# Patient Record
Sex: Female | Born: 1970 | Race: White | Hispanic: No | Smoking: Former smoker
Health system: Southern US, Community
[De-identification: ages and names within clinical notes are randomized; demographics above are authoritative.]

## PROBLEM LIST (undated history)

## (undated) DIAGNOSIS — F32A Depression, unspecified: Secondary | ICD-10-CM

## (undated) DIAGNOSIS — K219 Gastro-esophageal reflux disease without esophagitis: Secondary | ICD-10-CM

## (undated) DIAGNOSIS — S46912A Strain of unspecified muscle, fascia and tendon at shoulder and upper arm level, left arm, initial encounter: Secondary | ICD-10-CM

## (undated) DIAGNOSIS — E039 Hypothyroidism, unspecified: Secondary | ICD-10-CM

## (undated) DIAGNOSIS — F419 Anxiety disorder, unspecified: Secondary | ICD-10-CM

## (undated) DIAGNOSIS — F1010 Alcohol abuse, uncomplicated: Secondary | ICD-10-CM

## (undated) DIAGNOSIS — E079 Disorder of thyroid, unspecified: Secondary | ICD-10-CM

## (undated) DIAGNOSIS — F329 Major depressive disorder, single episode, unspecified: Secondary | ICD-10-CM

## (undated) DIAGNOSIS — F1020 Alcohol dependence, uncomplicated: Secondary | ICD-10-CM

## (undated) HISTORY — DX: Gastro-esophageal reflux disease without esophagitis: K21.9

## (undated) HISTORY — DX: Depression, unspecified: F32.A

## (undated) HISTORY — DX: Major depressive disorder, single episode, unspecified: F32.9

---

## 1998-01-25 ENCOUNTER — Other Ambulatory Visit: Admission: RE | Admit: 1998-01-25 | Discharge: 1998-01-25 | Payer: Self-pay | Admitting: Obstetrics and Gynecology

## 1998-02-23 ENCOUNTER — Inpatient Hospital Stay (HOSPITAL_COMMUNITY): Admission: AD | Admit: 1998-02-23 | Discharge: 1998-02-25 | Payer: Self-pay | Admitting: Obstetrics and Gynecology

## 1998-04-06 ENCOUNTER — Other Ambulatory Visit: Admission: RE | Admit: 1998-04-06 | Discharge: 1998-04-06 | Payer: Self-pay | Admitting: *Deleted

## 1999-05-16 ENCOUNTER — Other Ambulatory Visit: Admission: RE | Admit: 1999-05-16 | Discharge: 1999-05-16 | Payer: Self-pay | Admitting: Obstetrics and Gynecology

## 2000-02-03 ENCOUNTER — Inpatient Hospital Stay (HOSPITAL_COMMUNITY): Admission: AD | Admit: 2000-02-03 | Discharge: 2000-02-03 | Payer: Self-pay | Admitting: *Deleted

## 2000-02-11 ENCOUNTER — Inpatient Hospital Stay (HOSPITAL_COMMUNITY): Admission: AD | Admit: 2000-02-11 | Discharge: 2000-02-11 | Payer: Self-pay | Admitting: Obstetrics and Gynecology

## 2000-02-21 ENCOUNTER — Inpatient Hospital Stay (HOSPITAL_COMMUNITY): Admission: AD | Admit: 2000-02-21 | Discharge: 2000-02-23 | Payer: Self-pay | Admitting: Obstetrics and Gynecology

## 2000-02-25 ENCOUNTER — Encounter: Admission: RE | Admit: 2000-02-25 | Discharge: 2000-05-25 | Payer: Self-pay | Admitting: Obstetrics and Gynecology

## 2000-04-02 ENCOUNTER — Other Ambulatory Visit: Admission: RE | Admit: 2000-04-02 | Discharge: 2000-04-02 | Payer: Self-pay | Admitting: Obstetrics and Gynecology

## 2000-04-04 ENCOUNTER — Ambulatory Visit (HOSPITAL_COMMUNITY): Admission: RE | Admit: 2000-04-04 | Discharge: 2000-04-04 | Payer: Self-pay | Admitting: Obstetrics and Gynecology

## 2000-05-27 ENCOUNTER — Encounter: Admission: RE | Admit: 2000-05-27 | Discharge: 2000-07-24 | Payer: Self-pay | Admitting: Obstetrics and Gynecology

## 2001-04-29 ENCOUNTER — Other Ambulatory Visit: Admission: RE | Admit: 2001-04-29 | Discharge: 2001-04-29 | Payer: Self-pay | Admitting: Obstetrics and Gynecology

## 2002-08-18 ENCOUNTER — Other Ambulatory Visit: Admission: RE | Admit: 2002-08-18 | Discharge: 2002-08-18 | Payer: Self-pay | Admitting: Obstetrics and Gynecology

## 2002-11-21 ENCOUNTER — Inpatient Hospital Stay (HOSPITAL_COMMUNITY): Admission: EM | Admit: 2002-11-21 | Discharge: 2002-11-24 | Payer: Self-pay | Admitting: Psychiatry

## 2005-01-04 ENCOUNTER — Emergency Department (HOSPITAL_COMMUNITY): Admission: EM | Admit: 2005-01-04 | Discharge: 2005-01-04 | Payer: Self-pay | Admitting: Emergency Medicine

## 2005-10-21 ENCOUNTER — Emergency Department (HOSPITAL_COMMUNITY): Admission: EM | Admit: 2005-10-21 | Discharge: 2005-10-21 | Payer: Self-pay | Admitting: Emergency Medicine

## 2005-10-25 ENCOUNTER — Emergency Department (HOSPITAL_COMMUNITY): Admission: EM | Admit: 2005-10-25 | Discharge: 2005-10-26 | Payer: Self-pay | Admitting: Emergency Medicine

## 2006-04-12 ENCOUNTER — Ambulatory Visit (HOSPITAL_COMMUNITY): Admission: RE | Admit: 2006-04-12 | Discharge: 2006-04-12 | Payer: Self-pay | Admitting: Family Medicine

## 2014-11-02 LAB — TSH: TSH: 21.29 u[IU]/mL — AB (ref <=5.90)

## 2015-04-19 ENCOUNTER — Emergency Department (HOSPITAL_COMMUNITY)
Admission: EM | Admit: 2015-04-19 | Discharge: 2015-04-19 | Disposition: A | Discharge status: Home / Self Care | Payer: Medicaid Other | Attending: Emergency Medicine | Admitting: Emergency Medicine

## 2015-04-19 ENCOUNTER — Emergency Department (HOSPITAL_COMMUNITY): Payer: Medicaid Other

## 2015-04-19 ENCOUNTER — Encounter (HOSPITAL_COMMUNITY): Payer: Self-pay

## 2015-04-19 DIAGNOSIS — Z8639 Personal history of other endocrine, nutritional and metabolic disease: Secondary | ICD-10-CM | POA: Insufficient documentation

## 2015-04-19 DIAGNOSIS — Z3202 Encounter for pregnancy test, result negative: Secondary | ICD-10-CM | POA: Diagnosis not present

## 2015-04-19 DIAGNOSIS — N938 Other specified abnormal uterine and vaginal bleeding: Secondary | ICD-10-CM | POA: Insufficient documentation

## 2015-04-19 DIAGNOSIS — Z880 Allergy status to penicillin: Secondary | ICD-10-CM | POA: Insufficient documentation

## 2015-04-19 DIAGNOSIS — Z87891 Personal history of nicotine dependence: Secondary | ICD-10-CM | POA: Insufficient documentation

## 2015-04-19 DIAGNOSIS — N12 Tubulo-interstitial nephritis, not specified as acute or chronic: Secondary | ICD-10-CM | POA: Insufficient documentation

## 2015-04-19 DIAGNOSIS — R319 Hematuria, unspecified: Secondary | ICD-10-CM | POA: Diagnosis present

## 2015-04-19 HISTORY — DX: Disorder of thyroid, unspecified: E07.9

## 2015-04-19 LAB — CBC
HCT: 37.9 % (ref 36.0–46.0)
Hemoglobin: 12.7 g/dL (ref 12.0–15.0)
MCH: 32 pg (ref 26.0–34.0)
MCHC: 33.5 g/dL (ref 30.0–36.0)
MCV: 95.5 fL (ref 78.0–100.0)
Platelets: 324 10*3/uL (ref 150–400)
RBC: 3.97 MIL/uL (ref 3.87–5.11)
RDW: 12.2 % (ref 11.5–15.5)
WBC: 11.1 10*3/uL — ABNORMAL HIGH (ref 4.0–10.5)

## 2015-04-19 LAB — URINALYSIS, ROUTINE W REFLEX MICROSCOPIC
Bilirubin Urine: NEGATIVE
Glucose, UA: NEGATIVE mg/dL
Ketones, ur: NEGATIVE mg/dL
Nitrite: POSITIVE — AB
Protein, ur: 30 mg/dL — AB
Specific Gravity, Urine: 1.015 (ref 1.005–1.030)
Urobilinogen, UA: 0.2 mg/dL (ref 0.0–1.0)
pH: 6 (ref 5.0–8.0)

## 2015-04-19 LAB — URINE MICROSCOPIC-ADD ON

## 2015-04-19 LAB — BASIC METABOLIC PANEL
Anion gap: 6 (ref 5–15)
BUN: 7 mg/dL (ref 6–20)
CO2: 28 mmol/L (ref 22–32)
Calcium: 8.5 mg/dL — ABNORMAL LOW (ref 8.9–10.3)
Chloride: 100 mmol/L — ABNORMAL LOW (ref 101–111)
Creatinine, Ser: 0.65 mg/dL (ref 0.44–1.00)
GFR calc Af Amer: >60 mL/min (ref >60)
GFR calc non Af Amer: >60 mL/min (ref >60)
Glucose, Bld: 83 mg/dL (ref 65–99)
Potassium: 3.3 mmol/L — ABNORMAL LOW (ref 3.5–5.1)
Sodium: 134 mmol/L — ABNORMAL LOW (ref 135–145)

## 2015-04-19 LAB — PREGNANCY, URINE: Preg Test, Ur: NEGATIVE

## 2015-04-19 MED ORDER — KETOROLAC TROMETHAMINE 30 MG/ML IJ SOLN
30.0000 mg | Freq: Once | INTRAMUSCULAR | Status: AC
  Administered 2015-04-19: 30 mg via INTRAVENOUS
  Filled 2015-04-19: qty 1

## 2015-04-19 MED ORDER — SODIUM CHLORIDE 0.9 % IV BOLUS (SEPSIS)
1000.0000 mL | Freq: Once | INTRAVENOUS | Status: AC
  Administered 2015-04-19: 1000 mL via INTRAVENOUS

## 2015-04-19 MED ORDER — CEPHALEXIN 500 MG PO CAPS
500.0000 mg | ORAL_CAPSULE | Freq: Three times a day (TID) | ORAL | Status: DC
Start: 2015-04-19 — End: 2016-02-03

## 2015-04-19 MED ORDER — OXYCODONE-ACETAMINOPHEN 5-325 MG PO TABS: 1.0000 | ORAL_TABLET | Freq: Four times a day (QID) | ORAL | Status: DC | PRN

## 2015-04-19 MED ORDER — DEXTROSE 5 % IV SOLN
1.0000 g | Freq: Once | INTRAVENOUS | Status: AC
  Administered 2015-04-19: 1 g via INTRAVENOUS
  Filled 2015-04-19: qty 10

## 2015-04-19 MED ORDER — MORPHINE SULFATE (PF) 4 MG/ML IV SOLN
6.0000 mg | Freq: Once | INTRAVENOUS | Status: AC
  Administered 2015-04-19: 6 mg via INTRAVENOUS
  Filled 2015-04-19: qty 2

## 2015-04-19 MED ORDER — IBUPROFEN 600 MG PO TABS: 600.0000 mg | ORAL_TABLET | Freq: Three times a day (TID) | ORAL | Status: DC | PRN

## 2015-04-19 NOTE — ED Provider Notes (Signed)
CSN: [id number]     Arrival date & time 04/19/15  0930 History  This chart was scribed for Jola Schmidt, MD by Terressa Koyanagi, ED Scribe. This patient was seen in room APA02/APA02 and the patient's care was started at 11:34 AM.   Chief Complaint  Patient presents with  . Back Pain  . Hematuria   Patient is a 44 y.o. female presenting with hematuria. The history is provided by the patient. No language interpreter was used.  Hematuria Associated symptoms include abdominal pain.   PCP: Leonard Downing, MD HPI Comments: Nikyla Navedo is a 44 y.o. female who presents to the Emergency Department complaining of urinary frequency onset approximately one week ago; and, lower back pain, left lower abd pain, nausea, chills and vaginal bleeding  (dripping blood when she urinates) onset 3 days ago. Pt reports that she was seen by her PCP for urinary frequency last week who started her on bactrim; pt completed the bactrim without improvement. Pt denies any Hx of kidney stones.   Past Medical History  Diagnosis Date  . Thyroid disease    History reviewed. No pertinent past surgical history. No family history on file. Social History  Substance Use Topics  . Smoking status: Former Research scientist (life sciences)  . Smokeless tobacco: None  . Alcohol Use: No   OB History    No data available     Review of Systems  Constitutional: Positive for chills.  Gastrointestinal: Positive for nausea and abdominal pain.  Genitourinary: Positive for vaginal bleeding.  Musculoskeletal: Positive for back pain.  All other systems reviewed and are negative.  Allergies  Hydrocodone and Penicillins  Home Medications   Prior to Admission medications   Not on File   Triage Vitals: BP 117/75 mmHg  Pulse 91  Temp(Src) 98.4 F (36.9 C) (Oral)  Resp 18  Ht 5\' 4"  (1.626 m)  Wt 129 lb (58.514 kg)  BMI 22.13 kg/m2  SpO2 100%  LMP 04/05/2015 Physical Exam  Constitutional: She is oriented to person, place, and time. She appears  well-developed and well-nourished. No distress.  HENT:  Head: Normocephalic and atraumatic.  Eyes: EOM are normal.  Neck: Normal range of motion.  Cardiovascular: Normal rate, regular rhythm and normal heart sounds.   Pulmonary/Chest: Effort normal and breath sounds normal.  Abdominal: Soft. She exhibits no distension. There is no tenderness.  Musculoskeletal: Normal range of motion.  Neurological: She is alert and oriented to person, place, and time.  Skin: Skin is warm and dry.  Psychiatric: She has a normal mood and affect. Judgment normal.  Nursing note and vitals reviewed.   ED Course  Procedures (including critical care time) DIAGNOSTIC STUDIES: Oxygen Saturation is 100% on RA, nl by my interpretation.    COORDINATION OF CARE: 11:40 AM: Discussed treatment plan which includes pain meds, antibiotics, imaging with pt at bedside; patient verbalizes understanding and agrees with treatment plan.   Labs Review Labs Reviewed  URINALYSIS, ROUTINE W REFLEX MICROSCOPIC (NOT AT [facility]) - Abnormal; Notable for the following:    APPearance TURBID (*)    Hgb urine dipstick LARGE (*)    Protein, ur 30 (*)    Nitrite POSITIVE (*)    Leukocytes, UA MODERATE (*)    All other components within normal limits  URINE MICROSCOPIC-ADD ON - Abnormal; Notable for the following:    Bacteria, UA MANY (*)    All other components within normal limits  CBC - Abnormal; Notable for the following:    WBC 11.1 (*)  All other components within normal limits  BASIC METABOLIC PANEL - Abnormal; Notable for the following:    Sodium 134 (*)    Potassium 3.3 (*)    Chloride 100 (*)    Calcium 8.5 (*)    All other components within normal limits  URINE CULTURE  PREGNANCY, URINE    Imaging Review US Renal  04/19/2015   CLINICAL DATA:  Flank pain bilaterally for 2 weeks  EXAM: RENAL / URINARY TRACT ULTRASOUND COMPLETE  COMPARISON:  CT abdomen and pelvis April 12, 2006  FINDINGS: Right Kidney:  Length:  11.8 cm. Echogenicity and renal cortical thickness are within normal limits. No mass, perinephric fluid, or hydronephrosis visualized. No sonographically demonstrable calculus or ureterectasis.  Left Kidney:  Length: 10.8 cm. Echogenicity and renal cortical thickness are within normal limits. No mass, perinephric fluid, or hydronephrosis visualized. No sonographically demonstrable calculus or ureterectasis.  Bladder:  Appears normal for degree of bladder distention.  IMPRESSION: Study within normal limits.   Electronically Signed   By: Lowella Grip III M.D.   On: 04/19/2015 12:54   I have personally reviewed and evaluated these images and lab results as part of my medical decision-making.   EKG Interpretation None      MDM   Final diagnoses:  Pyelonephritis    1:09 PM Patient feels much better this time.  Symptoms consistent with pyelonephritis.  Primary care follow-up.  Patient understands return to ER for new or worsening symptoms.  Renal ultrasound demonstrates no unilateral hide her.  Urine culture sent.  IV Rocephin in the emergency department.  Home with Keflex.  I personally performed the services described in this documentation, which was scribed in my presence. The recorded information has been reviewed and is accurate.       Jola Schmidt, MD 04/19/15 1309

## 2015-04-19 NOTE — ED Notes (Signed)
Pt reports last weekend she started having UTI symptoms and was out of town.  Says she called her doctor and he called her in a prescription for bactrim.  Reports pt took all of her antibiotics and still c/o urinary frequency and now having pain in lower back and left lower abd.  Also reports gross amount of blood in urine and urine cloudy.  Pt says has had fever as well.

## 2015-04-19 NOTE — Discharge Instructions (Signed)
Pyelonephritis, Adult °Pyelonephritis is a kidney infection. In general, there are 2 main types of pyelonephritis: °· Infections that come on quickly without any warning (acute pyelonephritis). °· Infections that persist for a long period of time (chronic pyelonephritis). °CAUSES  °Two main causes of pyelonephritis are: °· Bacteria traveling from the bladder to the kidney. This is a problem especially in pregnant women. The urine in the bladder can become filled with bacteria from multiple causes, including: °¨ Inflammation of the prostate gland (prostatitis). °¨ Sexual intercourse in females. °¨ Bladder infection (cystitis). °· Bacteria traveling from the bloodstream to the tissue part of the kidney. °Problems that may increase your risk of getting a kidney infection include: °· Diabetes. °· Kidney stones or bladder stones. °· Cancer. °· Catheters placed in the bladder. °· Other abnormalities of the kidney or ureter. °SYMPTOMS  °· Abdominal pain. °· Pain in the side or flank area. °· Fever. °· Chills. °· Upset stomach. °· Blood in the urine (dark urine). °· Frequent urination. °· Strong or persistent urge to urinate. °· Burning or stinging when urinating. °DIAGNOSIS  °Your caregiver may diagnose your kidney infection based on your symptoms. A urine sample may also be taken. °TREATMENT  °In general, treatment depends on how severe the infection is.  °· If the infection is mild and caught early, your caregiver may treat you with oral antibiotics and send you home. °· If the infection is more severe, the bacteria may have gotten into the bloodstream. This will require intravenous (IV) antibiotics and a hospital stay. Symptoms may include: °¨ High fever. °¨ Severe flank pain. °¨ Shaking chills. °· Even after a hospital stay, your caregiver may require you to be on oral antibiotics for a period of time. °· Other treatments may be required depending upon the cause of the infection. °HOME CARE INSTRUCTIONS  °· Take your  antibiotics as directed. Finish them even if you start to feel better. °· Make an appointment to have your urine checked to make sure the infection is gone. °· Drink enough fluids to keep your urine clear or pale yellow. °· Take medicines for the bladder if you have urgency and frequency of urination as directed by your caregiver. °SEEK IMMEDIATE MEDICAL CARE IF:  °· You have a fever or persistent symptoms for more than 2-3 days. °· You have a fever and your symptoms suddenly get worse. °· You are unable to take your antibiotics or fluids. °· You develop shaking chills. °· You experience extreme weakness or fainting. °· There is no improvement after 2 days of treatment. °MAKE SURE YOU: °· Understand these instructions. °· Will watch your condition. °· Will get help right away if you are not doing well or get worse. °Document Released: 07/24/2005 Document Revised: 01/23/2012 Document Reviewed: 12/28/2010 °ExitCare® Patient Information ©2015 [employer]. This information is not intended to replace advice given to you by your health care provider. Make sure you discuss any questions you have with your health care provider. ° °

## 2015-04-22 LAB — URINE CULTURE: Culture: >=100000

## 2015-04-24 ENCOUNTER — Telehealth (HOSPITAL_COMMUNITY): Payer: Self-pay | Admitting: Emergency Medicine

## 2015-04-24 NOTE — Telephone Encounter (Signed)
Post ED Visit - Positive Culture Follow-up  Culture report reviewed by antimicrobial stewardship pharmacist:  []  Heide Guile, Pharm.D., BCPS []  Alycia Rossetti, Pharm.D., BCPS []  [place], Pharm.D., BCPS, AAHIVP []  Legrand Como, Pharm.D., BCPS, AAHIVP []  [place], Pharm.D. [x]  Cassie Nicole Kindred, [place].D.  Positive Urine culture Treated with Cephalexin, organism sensitive to the same and no further patient follow-up is required at this time.  Ernesta Amble 04/24/2015, 4:42 PM

## 2016-01-24 ENCOUNTER — Telehealth: Payer: Self-pay | Admitting: Orthopedic Surgery

## 2016-01-24 NOTE — Telephone Encounter (Addendum)
Call received from patient regarding an injury to her left shoulder at [facility] on 01/15/16.  States was treated at Urgent care there and has physician notes and report.  Relayed need films as well.  States she will speak with facility and request copy of films.  Also discussed insurance, which, per system, does not appear to require primary care referral.  Patient aware appointment pending.  Ph# [phone]

## 2016-01-26 NOTE — Telephone Encounter (Signed)
Called back to patient to follow up, as no response.  Patient states she is on way today back to [facility] and will pick up copy of report and film/C.D, then will call to schedule appointment when she returns.

## 2016-02-03 ENCOUNTER — Encounter: Payer: Self-pay | Admitting: Orthopaedic Surgery

## 2016-02-03 ENCOUNTER — Ambulatory Visit (INDEPENDENT_AMBULATORY_CARE_PROVIDER_SITE_OTHER): Payer: Medicaid Other | Admitting: Orthopaedic Surgery

## 2016-02-03 VITALS — BP 128/87 | HR 81 | Temp 97.7°F | Resp 16 | Ht 64.0 in | Wt 143.0 lb

## 2016-02-03 DIAGNOSIS — M25512 Pain in left shoulder: Secondary | ICD-10-CM | POA: Diagnosis not present

## 2016-02-03 MED ORDER — HYDROCODONE-ACETAMINOPHEN 5-325 MG PO TABS: 1.0000 | ORAL_TABLET | ORAL | Status: DC | PRN

## 2016-02-03 MED ORDER — NAPROXEN 500 MG PO TABS: 500.0000 mg | ORAL_TABLET | Freq: Two times a day (BID) | ORAL | Status: DC

## 2016-02-03 NOTE — Progress Notes (Signed)
Subjective: I hurt my left shoulder two weeks ago    Patient ID: Ebony Jones, female    DOB: 15-Oct-1970, 45 y.o.   MRN: [id number]  HPI She fell while at [facility] two weeks ago and hurt her left shoulder.  She had been drinking and lost her balance.  She was seen in the ER in [place], [place].  I have copies of the ER report, x-ray report and x-rays all of which I have reviewed.  She had no other injury.  Over the last two weeks her left shoulder has continued to hurt and she has not improved.  She has pain with overhead use and with abduction.  She has no numbness.  She has taken Tylenol and used ice with little help.  She has problems sleeping.  She has no swelling or redness.   Review of Systems  HENT: Negative for congestion.   Respiratory: Negative for cough and shortness of breath.   Cardiovascular: Negative for chest pain and leg swelling.  Endocrine: Positive for cold intolerance.  Musculoskeletal: Positive for arthralgias.  Allergic/Immunologic: Positive for environmental allergies.   Past Medical History  Diagnosis Date  . Thyroid disease     History reviewed. No pertinent past surgical history.  No current outpatient prescriptions on file prior to visit.   No current facility-administered medications on file prior to visit.    Social History   Social History  . Marital Status: Divorced    Spouse Name: N/A  . Number of Children: N/A  . Years of Education: N/A   Occupational History  . Not on file.   Social History Main Topics  . Smoking status: Former Research scientist (life sciences)  . Smokeless tobacco: Not on file  . Alcohol Use: No  . Drug Use: No  . Sexual Activity: Not on file   Other Topics Concern  . Not on file   Social History Narrative    BP 128/87 mmHg  Pulse 81  Temp(Src) 97.7 F (36.5 C)  Resp 16  Ht 5\' 4"  (1.626 m)  Wt 143 lb (64.864 kg)  BMI 24.53 kg/m2      Objective:   Physical Exam  Constitutional: She is oriented to person, place,  and time. She appears well-developed and well-nourished.  HENT:  Head: Normocephalic and atraumatic.  Eyes: Conjunctivae and EOM are normal. Pupils are equal, round, and reactive to light.  Neck: Normal range of motion. Neck supple.  Cardiovascular: Normal rate, regular rhythm and intact distal pulses.   Pulmonary/Chest: Effort normal.  Abdominal: Soft.  Musculoskeletal: She exhibits tenderness (Left shoulder has tenderness, abduction 145, forward flexion 160, internal 20, external 30, extension full but tender, adduction full.  NV intact.  Grips normal.  Neck negative.  Right shoulder negative.).  Neurological: She is alert and oriented to person, place, and time. She displays normal reflexes. No cranial nerve deficit. She exhibits normal muscle tone. Coordination normal.  Skin: Skin is warm and dry.  Psychiatric: She has a normal mood and affect. Her behavior is normal. Judgment and thought content normal.          Assessment & Plan:   Encounter Diagnosis  Name Primary?  . Left shoulder pain Yes   I feel she has a rotator cuff stain as well.  I have gone over exercises for her to do.  I have shown them to her.  I will have her do them at home.  I have given Rx for Naprosyn and Norco 5.0.  Precautions  discussed.  Call if any problems.  Return in two weeks.  Consider MRI if not getting better.  Electronically Signed Sanjuana Kava, MD 6/29/20173:51 PM

## 2016-02-16 ENCOUNTER — Encounter: Payer: Self-pay | Admitting: Orthopaedic Surgery

## 2016-02-16 ENCOUNTER — Ambulatory Visit: Payer: Medicaid Other | Admitting: Orthopaedic Surgery

## 2016-02-23 ENCOUNTER — Encounter: Payer: Self-pay | Admitting: Orthopaedic Surgery

## 2016-02-23 ENCOUNTER — Ambulatory Visit (INDEPENDENT_AMBULATORY_CARE_PROVIDER_SITE_OTHER): Payer: Medicaid Other | Admitting: Orthopaedic Surgery

## 2016-02-23 VITALS — BP 116/77 | HR 83 | Ht 63.0 in | Wt 146.6 lb

## 2016-02-23 DIAGNOSIS — M25512 Pain in left shoulder: Secondary | ICD-10-CM

## 2016-02-23 NOTE — Progress Notes (Signed)
Patient Ebony Jones, female DOB:1971/04/25, 45 y.o. [id number]  Chief Complaint  Patient presents with  . Follow-up    left shoulder    HPI  Ebony Jones is a 45 y.o. female who has left shoulder pain.  She is improved.  She has less pain and more motion.  She is doing her exercises.  She still has an area of motion that is painful and still has some soreness.  But she is pleased with her progress to date.  She is to continue her exercises.  She is to gradually increase the weight she lifts.  HPI  Body mass index is 25.98 kg/(m^2).  ROS  Review of Systems  HENT: Negative for congestion.   Respiratory: Negative for cough and shortness of breath.   Cardiovascular: Negative for chest pain and leg swelling.  Endocrine: Positive for cold intolerance.  Musculoskeletal: Positive for arthralgias.  Allergic/Immunologic: Positive for environmental allergies.    Past Medical History  Diagnosis Date  . Thyroid disease     History reviewed. No pertinent past surgical history.  History reviewed. No pertinent family history.  Social History Social History  Substance Use Topics  . Smoking status: Former Research scientist (life sciences)  . Smokeless tobacco: None  . Alcohol Use: No    Allergies  Allergen Reactions  . Hydrocodone Rash  . Penicillins Rash    Current Outpatient Prescriptions  Medication Sig Dispense Refill  . HYDROcodone-acetaminophen (NORCO/VICODIN) 5-325 MG tablet Take 1 tablet by mouth every 4 (four) hours as needed for moderate pain (Must last 15 days.Do not take and drive a car or use machinery.). 60 tablet 0  . levothyroxine (SYNTHROID, LEVOTHROID) 112 MCG tablet Take 112 mcg by mouth daily before breakfast.    . naproxen (NAPROSYN) 500 MG tablet Take 1 tablet (500 mg total) by mouth 2 (two) times daily with a meal. 60 tablet 5  . sertraline (ZOLOFT) 100 MG tablet Take 100 mg by mouth daily.     No current facility-administered medications for this visit.     Physical  Exam  Blood pressure 116/77, pulse 83, height 5\' 3"  (1.6 m), weight 146 lb 9.6 oz (66.497 kg).  Constitutional: overall normal hygiene, normal nutrition, well developed, normal grooming, normal body habitus. Assistive device:none  Musculoskeletal: gait and station Limp none, muscle tone and strength are normal, no tremors or atrophy is present.  .  Neurological: coordination overall normal.  Deep tendon reflex/nerve stretch intact.  Sensation normal.  Cranial nerves II-XII intact.   Skin:   normal overall no scars, lesions, ulcers or rashes. No psoriasis.  Psychiatric: Alert and oriented x 3.  Recent memory intact, remote memory unclear.  Normal mood and affect. Well groomed.  Good eye contact.  Cardiovascular: overall no swelling, no varicosities, no edema bilaterally, normal temperatures of the legs and arms, no clubbing, cyanosis and good capillary refill.  Lymphatic: palpation is normal.  Examination of left Upper Extremity is done.  Inspection:   Overall:  Elbow non-tender without crepitus or defects, forearm non-tender without crepitus or defects, wrist non-tender without crepitus or defects, hand non-tender.    Shoulder: with glenohumeral joint tenderness, without effusion.   Upper arm: without swelling and tenderness   Range of motion:   Overall:  Full range of motion of the elbow, full range of motion of wrist and full range of motion in fingers.   Shoulder:  left  165 degrees forward flexion; 145 degrees abduction; 40 degrees internal rotation, 40 degrees external rotation, 20 degrees extension, 40  degrees adduction.   Stability:   Overall:  Shoulder, elbow and wrist stable   Strength and Tone:   Overall full shoulder muscles strength, full upper arm strength and normal upper arm bulk and tone.   The patient has been educated about the nature of the problem(s) and counseled on treatment options.  The patient appeared to understand what I have discussed and is in  agreement with it.  Encounter Diagnosis  Name Primary?  . Left shoulder pain Yes    PLAN Call if any problems.  Precautions discussed.  Continue current medications.   Return to clinic 1 month   Continue exercises at home.  Electronically Signed Sanjuana Kava, MD 7/19/201711:08 PM

## 2016-03-16 ENCOUNTER — Encounter (HOSPITAL_COMMUNITY): Payer: Self-pay

## 2016-03-16 ENCOUNTER — Emergency Department (HOSPITAL_COMMUNITY)
Admission: EM | Admit: 2016-03-16 | Discharge: 2016-03-16 | Disposition: A | Discharge status: Home / Self Care | Payer: Medicaid Other | Attending: Emergency Medicine | Admitting: Emergency Medicine

## 2016-03-16 ENCOUNTER — Emergency Department (HOSPITAL_COMMUNITY): Payer: Medicaid Other

## 2016-03-16 DIAGNOSIS — Y929 Unspecified place or not applicable: Secondary | ICD-10-CM | POA: Insufficient documentation

## 2016-03-16 DIAGNOSIS — Y999 Unspecified external cause status: Secondary | ICD-10-CM | POA: Diagnosis not present

## 2016-03-16 DIAGNOSIS — Z79899 Other long term (current) drug therapy: Secondary | ICD-10-CM | POA: Insufficient documentation

## 2016-03-16 DIAGNOSIS — S42251A Displaced fracture of greater tuberosity of right humerus, initial encounter for closed fracture: Secondary | ICD-10-CM | POA: Diagnosis not present

## 2016-03-16 DIAGNOSIS — W109XXA Fall (on) (from) unspecified stairs and steps, initial encounter: Secondary | ICD-10-CM | POA: Diagnosis not present

## 2016-03-16 DIAGNOSIS — Y939 Activity, unspecified: Secondary | ICD-10-CM | POA: Insufficient documentation

## 2016-03-16 DIAGNOSIS — S42252A Displaced fracture of greater tuberosity of left humerus, initial encounter for closed fracture: Secondary | ICD-10-CM

## 2016-03-16 DIAGNOSIS — Z87891 Personal history of nicotine dependence: Secondary | ICD-10-CM | POA: Diagnosis not present

## 2016-03-16 DIAGNOSIS — S4991XA Unspecified injury of right shoulder and upper arm, initial encounter: Secondary | ICD-10-CM | POA: Diagnosis present

## 2016-03-16 LAB — CBC WITH DIFFERENTIAL/PLATELET
Basophils Absolute: 0 10*3/uL (ref 0.0–0.1)
Basophils Relative: 1 %
Eosinophils Absolute: 0.2 10*3/uL (ref 0.0–0.7)
Eosinophils Relative: 3 %
HCT: 38.4 % (ref 36.0–46.0)
Hemoglobin: 13.1 g/dL (ref 12.0–15.0)
Lymphocytes Relative: 34 %
Lymphs Abs: 2.6 10*3/uL (ref 0.7–4.0)
MCH: 32.6 pg (ref 26.0–34.0)
MCHC: 34.1 g/dL (ref 30.0–36.0)
MCV: 95.5 fL (ref 78.0–100.0)
Monocytes Absolute: 0.7 10*3/uL (ref 0.1–1.0)
Monocytes Relative: 10 %
Neutro Abs: 4 10*3/uL (ref 1.7–7.7)
Neutrophils Relative %: 52 %
Platelets: 233 10*3/uL (ref 150–400)
RBC: 4.02 MIL/uL (ref 3.87–5.11)
RDW: 12.2 % (ref 11.5–15.5)
WBC: 7.5 10*3/uL (ref 4.0–10.5)

## 2016-03-16 LAB — COMPREHENSIVE METABOLIC PANEL
ALT: 10 U/L — ABNORMAL LOW (ref 14–54)
AST: 16 U/L (ref 15–41)
Albumin: 3.9 g/dL (ref 3.5–5.0)
Alkaline Phosphatase: 47 U/L (ref 38–126)
Anion gap: 9 (ref 5–15)
BUN: 10 mg/dL (ref 6–20)
CO2: 22 mmol/L (ref 22–32)
Calcium: 8.1 mg/dL — ABNORMAL LOW (ref 8.9–10.3)
Chloride: 108 mmol/L (ref 101–111)
Creatinine, Ser: 0.64 mg/dL (ref 0.44–1.00)
GFR calc Af Amer: >60 mL/min (ref >60)
GFR calc non Af Amer: >60 mL/min (ref >60)
Glucose, Bld: 75 mg/dL (ref 65–99)
Potassium: 3.4 mmol/L — ABNORMAL LOW (ref 3.5–5.1)
Sodium: 139 mmol/L (ref 135–145)
Total Bilirubin: 0.3 mg/dL (ref 0.3–1.2)
Total Protein: 6.5 g/dL (ref 6.5–8.1)

## 2016-03-16 LAB — RAPID URINE DRUG SCREEN, HOSP PERFORMED
Amphetamines: NOT DETECTED
Barbiturates: NOT DETECTED
Benzodiazepines: NOT DETECTED
Cocaine: NOT DETECTED
Opiates: NOT DETECTED
Tetrahydrocannabinol: NOT DETECTED

## 2016-03-16 LAB — ETHANOL: Alcohol, Ethyl (B): 248 mg/dL — ABNORMAL HIGH (ref <5)

## 2016-03-16 MED ORDER — IBUPROFEN 400 MG PO TABS: 400.0000 mg | ORAL_TABLET | Freq: Four times a day (QID) | ORAL | 0 refills | Status: DC | PRN

## 2016-03-16 MED ORDER — IBUPROFEN 800 MG PO TABS
800.0000 mg | ORAL_TABLET | Freq: Once | ORAL | Status: AC
  Administered 2016-03-16: 800 mg via ORAL
  Filled 2016-03-16: qty 1

## 2016-03-16 NOTE — ED Provider Notes (Signed)
[facility] DEPT Provider Note   CSN: [id number] Arrival date & time: 03/16/16  1702  First Provider Contact:  First MD Initiated Contact with Patient 03/16/16 1726        History   Chief Complaint Chief Complaint  Patient presents with  . Fall  . Other    detox    HPI Ebony Jones is a 45 y.o. female.  Dog tripped owner, she can't state how she fell but is having pain in the R shoulder - hx of problems in this joint int he past - ? rotater cuff problems.  She has had dec ROM since the fall b/c of pain.  She has no numbness / tingling in the hand.  She has no pain at the elbow or wrist - can move neck in all directions.  She has no injury to the head or LOC.   She was drinking ETOH today.  Sx occurred just prior to arrival.  Arrived by EMS.  Also c/o ETOH abuse and reqeusting help.  - no depresseion  / SI      Past Medical History:  Diagnosis Date  . Thyroid disease     There are no active problems to display for this patient.   History reviewed. No pertinent surgical history.  OB History    No data available       Home Medications    Prior to Admission medications   Medication Sig Start Date End Date Taking? Authorizing Provider  levothyroxine (SYNTHROID, LEVOTHROID) 112 MCG tablet Take 112 mcg by mouth daily before breakfast.   Yes Historical Provider, MD  HYDROcodone-acetaminophen (NORCO/VICODIN) 5-325 MG tablet Take 1 tablet by mouth every 4 (four) hours as needed for moderate pain (Must last 15 days.Do not take and drive a car or use machinery.). Patient not taking: Reported on 03/16/2016 02/03/16   Sanjuana Kava, MD  ibuprofen (ADVIL,MOTRIN) 400 MG tablet Take 1 tablet (400 mg total) by mouth every 6 (six) hours as needed. 03/16/16   Noemi Chapel, MD  naproxen (NAPROSYN) 500 MG tablet Take 1 tablet (500 mg total) by mouth 2 (two) times daily with a meal. Patient not taking: Reported on 03/16/2016 02/03/16   Sanjuana Kava, MD    Family History No family  history on file.  Social History Social History  Substance Use Topics  . Smoking status: Former Research scientist (life sciences)  . Smokeless tobacco: Never Used  . Alcohol use No     Allergies   Hydrocodone and Penicillins   Review of Systems Review of Systems  All other systems reviewed and are negative.    Physical Exam Updated Vital Signs BP 113/65 (BP Location: Right Arm)   Pulse 120   Temp 98.9 F (37.2 C) (Oral)   Resp 20   LMP 02/24/2016   SpO2 96%   Physical Exam  Constitutional: She appears well-developed and well-nourished. No distress.  HENT:  Head: Normocephalic and atraumatic.  Mouth/Throat: Oropharynx is clear and moist. No oropharyngeal exudate.  Eyes: Conjunctivae and EOM are normal. Pupils are equal, round, and reactive to light. Right eye exhibits no discharge. Left eye exhibits no discharge. No scleral icterus.  Neck: Normal range of motion. Neck supple. No JVD present. No thyromegaly present.  Cardiovascular: Normal rate, regular rhythm, normal heart sounds and intact distal pulses.  Exam reveals no gallop and no friction rub.   No murmur heard. Pulmonary/Chest: Effort normal and breath sounds normal. No respiratory distress. She has no wheezes. She has no rales.  Abdominal: Soft.  Bowel sounds are normal. She exhibits no distension and no mass. There is no tenderness.  Musculoskeletal: She exhibits tenderness ( ttp over the shoulder). She exhibits no edema.  Dec ROM 2/2 pain but only mildly, able to lift arm above head without wincing.  Normal pulses at the wrist.  Lymphadenopathy:    She has no cervical adenopathy.  Neurological: She is alert. Coordination normal.  Normal strength / sensation / speech (appears intox)  Skin: Skin is warm and dry. No rash noted. No erythema.  Psychiatric:  Tearful but no suicidal thoughts Drinks > 1/5th of liquor daily - hiding it  Nursing note and vitals reviewed.    ED Treatments / Results  Labs (all labs ordered are listed, but  only abnormal results are displayed) Labs Reviewed  ETHANOL - Abnormal; Notable for the following:       Result Value   Alcohol, Ethyl (B) 248 (*)    All other components within normal limits  COMPREHENSIVE METABOLIC PANEL - Abnormal; Notable for the following:    Potassium 3.4 (*)    Calcium 8.1 (*)    ALT 10 (*)    All other components within normal limits  CBC WITH DIFFERENTIAL/PLATELET  URINE RAPID DRUG SCREEN, HOSP PERFORMED    EKG  EKG Interpretation None       Radiology Dg Shoulder Left  Result Date: 03/16/2016 CLINICAL DATA:  Status post trip and fall over a dog today with a left shoulder injury. Pain. Initial encounter. EXAM: LEFT SHOULDER - 2+ VIEW COMPARISON:  None. FINDINGS: The patient has a mildly impacted and comminuted fracture of the greater tuberosity. No other fracture is identified. The humerus is located and the acromioclavicular joint is intact. Imaged left lung and ribs appear normal. IMPRESSION: Mildly impacted and comminuted greater tuberosity fracture. Electronically Signed   By: Inge Rise M.D.   On: 03/16/2016 17:45    Procedures Procedures (including critical care time)  Medications Ordered in ED Medications  ibuprofen (ADVIL,MOTRIN) tablet 800 mg (800 mg Oral Given 03/16/16 1915)     Initial Impression / Assessment and Plan / ED Course  I have reviewed the triage vital signs and the nursing notes.  Pertinent labs & imaging results that were available during my care of the patient were reviewed by me and considered in my medical decision making (see chart for details).  Clinical Course  Comment By Time  Pt requesting d/c - will give copy of results Has sober ride, informed of xray findings - sling provided Noemi Chapel, MD 08/10 1930   Not sucidal  Final Clinical Impressions(s) / ED Diagnoses   Final diagnoses:  Greater tuberosity of humerus fracture, left, closed, initial encounter    New Prescriptions New Prescriptions    IBUPROFEN (ADVIL,MOTRIN) 400 MG TABLET    Take 1 tablet (400 mg total) by mouth every 6 (six) hours as needed.     Noemi Chapel, MD 03/16/16 [phone]

## 2016-03-16 NOTE — ED Triage Notes (Signed)
Pt says she tripped over her dog and fell down 3 steps hitting left shoulder.  Reports she already had an injury to that shoulder.  Pt also reports has been drinking etoh today. Pt says she drinks heavily twice a week and wants help to stop drinking.

## 2016-03-16 NOTE — Discharge Instructions (Signed)
You have a chip type fracture of your left shoulder - you can wear the sling for a week and motrin for pain control - I have attached your blood work should you decide to seek help with your alcoholSubstance Abuse Treatment Programs  Intensive Outpatient Programs [facility]     [address], [place]       [facility] [place] [address] [place], [place]  [facility]     (Inpatient and outpatient)     [address]           [place] [phone] (Suboxone and Methadone)  [place], [place] [address]      [place] [address] [phone] [place], [place]  [facility] (Outpatient/Inpatient, Chemical)    (insurance only) [phone]             [facility] ([place]) [place], [place]     [facility]     [address]     [place], [place]       [facility] (for caregivers and family) [phone] Kristeen Mans. [place], [place]      Residential Treatment Programs [facility]      [address], [place], [place] [address]  [phone]       T.R.O.S.Kyle., [place], [place] [address] [phone]  [facility] [place]        [phone]       [facility] [phone]  [facility] (Short.)             [place], [place] or [place] of [place], [address] [phone]  [facility]    [address]      [place], [place]       [facility] [address] [place], [place]  Hometown   [address] [place], [place] [address]     [phone]      Admissions: 8am-3pm M-F  [facility] (RTS) [address] [place], [place]  [facility] Program: Residential Program [phone] Days)   [place], [place] or [phone]     ADATC: [facility] [place], [place] (Walk in Hours over the weekend or by referral)  [facility] [address]  [place], [place], [place] [address] [phone]  Crisis Mobile: [facility]:  [phone] (for crisis response 24 hours a day) [facility] Hotline:      [phone] Outpatient Psychiatry and Counseling  [facility]: Mobile Crisis Management 24 hours:  [phone]  [facility] of the [employer] sliding scale fee and walk in schedule: M-F 8am-12pm/1pm-3pm [place], [place] [address] [address] [place], [place] [address] [phone]  [facility] (Formerly known as [facility] [employer])- new patient walk-in appointments available Monday - Friday 8am -3pm.          [address] [place], [place] [address] [phone] or crisis line- [place] [facility]/ Intensive Outpatient Therapy Program [place], [place] [address] [place]      [phone] [address] [place], [place] [address]                 [place]   [facility] [phone]. [address] [place], [place] [address]   [facility]          [address] Johnette Abraham  [place], [place] [address]       [phone]  [facility] [address], [place], [place] [address] [phone]  [facility]    [address] [place],  [place] [address]     [place], [place] Joycelyn Man     [place] [address]     [phone]       [facility] [place] [address]  [facility]     [address], [place], [place], [place] [address] [place]     [address]     [place], [place] [address]     [phone]       [facility] [address] [place], [place] [address] [phone] Resources for Temporary Residential Assistance/Crisis [facility]) M-F 8am-3pm   [address], [place] [address]   [phone] Services include: laundry, barbering, support groups, case management, phone  & computer access, showers, AA/NA mtgs, mental health/substance abuse nurse, job skills class, disability information, VA assistance, spiritual classes, etc.   [facility]   [address], [place]     [place]              [employer] (women and children)       [place]. [place],  [address] [phone] [email or web address] for application and process Application Required  [facility]   [address]    [place] [address]     [phone]                    [place] Cornelia Copa  [place], [place] [address] [id number] application appt.) Application Required  [facility] (women only)    [address]     [place], [place] [address]     [phone]      Intake starts 6pm daily Need valid ID, SSC, & Police report [employer] [address] [place], [place] [id number] Application Required  [employer] (men only)     [place].      [place], [place]       [place] (Pregnant women only) [address] [place], [place]  [facility]      [place] Dani [address].      [place], [place] [address]     [phone]             [facility] [address] [place], [place] 90 day commitment/SA/Application process  [facility](men only)     [address]     [place], [place]       Check-in at [facility] [address] [place], [place] [address] [phone] Men/Women/Women and Children must be there by 7 pm  [place], [place]

## 2016-03-22 ENCOUNTER — Ambulatory Visit: Payer: Medicaid Other | Admitting: Orthopaedic Surgery

## 2016-05-22 ENCOUNTER — Ambulatory Visit (INDEPENDENT_AMBULATORY_CARE_PROVIDER_SITE_OTHER): Payer: Medicaid Other | Admitting: "Endocrinology

## 2016-05-22 ENCOUNTER — Encounter: Payer: Self-pay | Admitting: "Endocrinology

## 2016-05-22 VITALS — BP 116/77 | HR 64 | Ht 63.0 in | Wt 147.0 lb

## 2016-05-22 DIAGNOSIS — E038 Other specified hypothyroidism: Secondary | ICD-10-CM | POA: Diagnosis not present

## 2016-05-22 DIAGNOSIS — E039 Hypothyroidism, unspecified: Secondary | ICD-10-CM | POA: Insufficient documentation

## 2016-05-22 LAB — T4, FREE: Free T4: 1.4 ng/dL (ref 0.8–1.8)

## 2016-05-22 LAB — TSH: TSH: 6.28 mIU/L — ABNORMAL HIGH

## 2016-05-22 LAB — T3, FREE: T3, Free: 2.6 pg/mL (ref 2.3–4.2)

## 2016-05-22 NOTE — Progress Notes (Signed)
Subjective:    Patient ID: Ebony Jones, female    DOB: 13-Dec-1970, PCP Leonard Downing, MD   Past Medical History:  Diagnosis Date  . Thyroid disease    History reviewed. No pertinent surgical history. Social History   Social History  . Marital status: Divorced    Spouse name: N/A  . Number of children: N/A  . Years of education: N/A   Social History Main Topics  . Smoking status: Former Research scientist (life sciences)  . Smokeless tobacco: Never Used  . Alcohol use No  . Drug use: No  . Sexual activity: Not Asked   Other Topics Concern  . None   Social History Narrative  . None   Outpatient Encounter Prescriptions as of 05/22/2016  Medication Sig  . levothyroxine (SYNTHROID, LEVOTHROID) 112 MCG tablet Take 112 mcg by mouth daily before breakfast.  . sertraline (ZOLOFT) 50 MG tablet Take 50 mg by mouth daily.  . [DISCONTINUED] HYDROcodone-acetaminophen (NORCO/VICODIN) 5-325 MG tablet Take 1 tablet by mouth every 4 (four) hours as needed for moderate pain (Must last 15 days.Do not take and drive a car or use machinery.). (Patient not taking: Reported on 03/16/2016)  . [DISCONTINUED] ibuprofen (ADVIL,MOTRIN) 400 MG tablet Take 1 tablet (400 mg total) by mouth every 6 (six) hours as needed.  . [DISCONTINUED] naproxen (NAPROSYN) 500 MG tablet Take 1 tablet (500 mg total) by mouth 2 (two) times daily with a meal. (Patient not taking: Reported on 03/16/2016)   No facility-administered encounter medications on file as of 05/22/2016.    ALLERGIES: Allergies  Allergen Reactions  . Hydrocodone Rash  . Penicillins Rash    Has patient had a PCN reaction causing immediate rash, facial/tongue/throat swelling, SOB or lightheadedness with hypotension: Yes Has patient had a PCN reaction causing severe rash involving mucus membranes or skin necrosis: No Has patient had a PCN reaction that required hospitalization No Has patient had a PCN reaction occurring within the last 10 years: No  If all of  the above answers are "NO", then may proceed with Cephalosporin use.    VACCINATION STATUS:  There is no immunization history on file for this patient.  HPI  45 year old female patient with medical history as above. She is being seen in consultation for hypothyroidism requested by Dr. Claris Gower. - She reports that she was diagnosed with hypothyroidism 3 years ago. She denies history of thyroidectomy nor radioactive iodine therapy. - She was treated with various doses of levothyroxine currently at 112 g by mouth every morning. - She takes this medication with her Zoloft. She has family history of unidentified thyroid problem in her father and one of her aunts. - She reports episodes of mood swings, anxiety, irritability. She denies recent weight loss. She denies palpitations. She denies heat or cold intolerance.  Review of Systems  Constitutional: no weight gain/loss, no fatigue, no subjective hyperthermia/hypothermia Eyes: no blurry vision, no xerophthalmia ENT: no sore throat, no nodules palpated in throat, no dysphagia/odynophagia, no hoarseness Cardiovascular: no CP/SOB/palpitations/leg swelling Respiratory: no cough/SOB Gastrointestinal: no N/V/D/C Musculoskeletal: no muscle/joint aches Skin: no rashes Neurological: no tremors,  - numbness, - tingling/dizziness Psychiatric:  + depression,  +anxiety  Objective:    BP 116/77   Pulse 64   Ht 5\' 3"  (1.6 m)   Wt 147 lb (66.7 kg)   BMI 26.04 kg/m   Wt Readings from Last 3 Encounters:  05/22/16 147 lb (66.7 kg)  02/23/16 146 lb 9.6 oz (66.5 kg)  02/03/16 143 lb (64.9  kg)    Physical Exam Constitutional: in NAD Eyes: PERRLA, EOMI, no exophthalmos ENT: moist mucous membranes, no thyromegaly, no cervical lymphadenopathy Cardiovascular: RRR, No MRG Respiratory: CTA B Gastrointestinal: abdomen soft, NT, ND, BS+ Musculoskeletal: no deformities, strength intact in all 4 Skin: moist, warm, no rashes Neurological: + tremor  with outstretched hands, DTR normal in all 4  CMP ( most recent) CMP     Component Value Date/Time   NA 139 03/16/2016 1840   K 3.4 (L) 03/16/2016 1840   CL 108 03/16/2016 1840   CO2 22 03/16/2016 1840   GLUCOSE 75 03/16/2016 1840   BUN 10 03/16/2016 1840   CREATININE 0.64 03/16/2016 1840   CALCIUM 8.1 (L) 03/16/2016 1840   PROT 6.5 03/16/2016 1840   ALBUMIN 3.9 03/16/2016 1840   AST 16 03/16/2016 1840   ALT 10 (L) 03/16/2016 1840   ALKPHOS 47 03/16/2016 1840   BILITOT 0.3 03/16/2016 1840   GFRNONAA >60 03/16/2016 1840   GFRAA >60 03/16/2016 1840    Her records shows that TSH was above target at 22.43 and 21.29 on 2 separate occasions prior to March 2017.    Assessment & Plan:   1. Other specified hypothyroidism -I have reviewed her available thyroid records and clinically evaluated the patient. - Patient seems to have well established long-standing hypothyroidism. She is currently on levothyroxine 112 g by mouth every morning.  - We discussed about correct intake of levothyroxine, at fasting, with water, separated by at least 30 minutes from breakfast, and separated by more than 4 hours from calcium, iron, multivitamins, acid reflux medications (PPIs). -Patient is made aware of the fact that thyroid hormone replacement is needed for life, dose to be adjusted by periodic monitoring of thyroid function tests. - Since she does not have recent full profile thyroid function test, I will send her to lab today. She will return in 1 week for reevaluation and dose adjustment if necessary. - She does not have clinical goiter hence no need for imaging studies at this time.  - I advised patient to maintain close follow up with Leonard Downing, MD for primary care needs. Follow up plan: Return in about 1 week (around 05/29/2016) for labs today.  Glade Lloyd, MD Phone: [phone]  Fax: [phone]   05/22/2016, 1:26 PM

## 2016-05-23 LAB — THYROGLOBULIN ANTIBODY: Thyroglobulin Ab: 1 IU/mL (ref <2)

## 2016-05-23 LAB — THYROID PEROXIDASE ANTIBODY: Thyroperoxidase Ab SerPl-aCnc: 460 IU/mL — ABNORMAL HIGH (ref <9)

## 2016-05-30 ENCOUNTER — Encounter: Payer: Self-pay | Admitting: "Endocrinology

## 2016-05-30 ENCOUNTER — Ambulatory Visit (INDEPENDENT_AMBULATORY_CARE_PROVIDER_SITE_OTHER): Payer: Medicaid Other | Admitting: "Endocrinology

## 2016-05-30 VITALS — BP 107/73 | HR 98 | Ht 63.0 in | Wt 148.0 lb

## 2016-05-30 DIAGNOSIS — E063 Autoimmune thyroiditis: Secondary | ICD-10-CM | POA: Diagnosis not present

## 2016-05-30 DIAGNOSIS — E038 Other specified hypothyroidism: Secondary | ICD-10-CM | POA: Diagnosis not present

## 2016-05-30 MED ORDER — LEVOTHYROXINE SODIUM 125 MCG PO TABS: 125.0000 ug | ORAL_TABLET | Freq: Every day | ORAL | 6 refills | Status: DC

## 2016-05-30 NOTE — Progress Notes (Signed)
Subjective:    Patient ID: Ebony Jones, female    DOB: 26-Apr-1971, PCP Leonard Downing, MD   Past Medical History:  Diagnosis Date  . Thyroid disease    History reviewed. No pertinent surgical history. Social History   Social History  . Marital status: Divorced    Spouse name: N/A  . Number of children: N/A  . Years of education: N/A   Social History Main Topics  . Smoking status: Former Research scientist (life sciences)  . Smokeless tobacco: Never Used  . Alcohol use No  . Drug use: No  . Sexual activity: Not Asked   Other Topics Concern  . None   Social History Narrative  . None   Outpatient Encounter Prescriptions as of 05/30/2016  Medication Sig  . levothyroxine (SYNTHROID, LEVOTHROID) 125 MCG tablet Take 1 tablet (125 mcg total) by mouth daily before breakfast.  . sertraline (ZOLOFT) 50 MG tablet Take 50 mg by mouth daily.  . [DISCONTINUED] levothyroxine (SYNTHROID, LEVOTHROID) 112 MCG tablet Take 112 mcg by mouth daily before breakfast.   No facility-administered encounter medications on file as of 05/30/2016.    ALLERGIES: Allergies  Allergen Reactions  . Hydrocodone Rash  . Penicillins Rash    Has patient had a PCN reaction causing immediate rash, facial/tongue/throat swelling, SOB or lightheadedness with hypotension: Yes Has patient had a PCN reaction causing severe rash involving mucus membranes or skin necrosis: No Has patient had a PCN reaction that required hospitalization No Has patient had a PCN reaction occurring within the last 10 years: No  If all of the above answers are "NO", then may proceed with Cephalosporin use.    VACCINATION STATUS:  There is no immunization history on file for this patient.  HPI  45 year old female patient with medical history as above. She is being seen in f/u  for hypothyroidism. - She reports that she was diagnosed with hypothyroidism 3 years ago. She denies history of thyroidectomy nor radioactive iodine therapy. - She was  treated with various doses of levothyroxine currently at 112 g by mouth every morning. - She takes this medication with her Zoloft. She has family history of unidentified thyroid problem in her father and one of her aunts. - She reports episodes of mood swings, anxiety, irritability. She denies recent weight loss. She denies palpitations. She denies heat or cold intolerance.  Review of Systems  Constitutional: no weight gain/loss, no fatigue, no subjective hyperthermia/hypothermia Eyes: no blurry vision, no xerophthalmia ENT: no sore throat, no nodules palpated in throat, no dysphagia/odynophagia, no hoarseness Cardiovascular: no CP/SOB/palpitations/leg swelling Respiratory: no cough/SOB Gastrointestinal: no N/V/D/C Musculoskeletal: no muscle/joint aches Skin: no rashes Neurological: no tremors,  - numbness, - tingling/dizziness Psychiatric:  + depression,  +anxiety  Objective:    BP 107/73   Pulse 98   Ht 5\' 3"  (1.6 m)   Wt 148 lb (67.1 kg)   BMI 26.22 kg/m   Wt Readings from Last 3 Encounters:  05/30/16 148 lb (67.1 kg)  05/22/16 147 lb (66.7 kg)  02/23/16 146 lb 9.6 oz (66.5 kg)    Physical Exam Constitutional: in NAD Eyes: PERRLA, EOMI, no exophthalmos ENT: moist mucous membranes, no thyromegaly, no cervical lymphadenopathy Cardiovascular: RRR, No MRG Respiratory: CTA B Gastrointestinal: abdomen soft, NT, ND, BS+ Musculoskeletal: no deformities, strength intact in all 4 Skin: moist, warm, no rashes Neurological: + tremor with outstretched hands, DTR normal in all 4  CMP ( most recent) CMP     Component Value Date/Time   NA  139 03/16/2016 1840   K 3.4 (L) 03/16/2016 1840   CL 108 03/16/2016 1840   CO2 22 03/16/2016 1840   GLUCOSE 75 03/16/2016 1840   BUN 10 03/16/2016 1840   CREATININE 0.64 03/16/2016 1840   CALCIUM 8.1 (L) 03/16/2016 1840   PROT 6.5 03/16/2016 1840   ALBUMIN 3.9 03/16/2016 1840   AST 16 03/16/2016 1840   ALT 10 (L) 03/16/2016 1840    ALKPHOS 47 03/16/2016 1840   BILITOT 0.3 03/16/2016 1840   GFRNONAA >60 03/16/2016 1840   GFRAA >60 03/16/2016 1840    Her records shows that TSH was above target at 22.43 and 21.29 on 2 separate occasions prior to March 2017.  Results for KALEB, DIMEGLIO (MRN [id number]) as of 05/30/2016 10:36  Ref. Range 05/22/2016 10:38  TSH Latest Units: mIU/L 6.28 (H)  Triiodothyronine,Free,Serum Latest Ref Range: 2.3 - 4.2 pg/mL 2.6  T4,Free(Direct) Latest Ref Range: 0.8 - 1.8 ng/dL 1.4  Thyroglobulin Ab Latest Ref Range: <2 IU/mL 1  Thyroperoxidase Ab SerPl-aCnc Latest Ref Range: <9 IU/mL 460 (H)    Assessment & Plan:   1. Other specified hypothyroidism  - Patient seems to have well established long-standing hypothyroidism.  - Based on her repeat thyroid function tests she will benefit from a slight increase in her levothyroxine. I will proceed to increase her levothyroxine to 125 g by mouth every morning.    - We discussed about correct intake of levothyroxine, at fasting, with water, separated by at least 30 minutes from breakfast, and separated by more than 4 hours from calcium, iron, multivitamins, acid reflux medications (PPIs). -Patient is made aware of the fact that thyroid hormone replacement is needed for life, dose to be adjusted by periodic monitoring of thyroid function tests. - Since she does not have recent full profile thyroid function test, I will send her to lab today. She will return in 1 week for reevaluation and dose adjustment if necessary. - She does not have clinical goiter hence no need for imaging studies at this time.  - I advised patient to maintain close follow up with Leonard Downing, MD for primary care needs. Follow up plan: Return in about 6 months (around 11/28/2016) for follow up with pre-visit labs.  Glade Lloyd, MD Phone: [phone]  Fax: [phone]   05/30/2016, 10:35 AM

## 2016-08-10 ENCOUNTER — Other Ambulatory Visit: Payer: Self-pay | Admitting: "Endocrinology

## 2016-08-10 LAB — TSH: TSH: 2.18 mIU/L

## 2016-08-10 LAB — T4, FREE: Free T4: 1.7 ng/dL (ref 0.8–1.8)

## 2016-08-16 ENCOUNTER — Encounter (HOSPITAL_COMMUNITY): Payer: Self-pay | Admitting: Emergency Medicine

## 2016-08-16 ENCOUNTER — Emergency Department (HOSPITAL_COMMUNITY): Payer: Medicaid Other

## 2016-08-16 ENCOUNTER — Encounter: Payer: Self-pay | Admitting: "Endocrinology

## 2016-08-16 ENCOUNTER — Ambulatory Visit (INDEPENDENT_AMBULATORY_CARE_PROVIDER_SITE_OTHER): Payer: Medicaid Other | Admitting: "Endocrinology

## 2016-08-16 ENCOUNTER — Emergency Department (HOSPITAL_COMMUNITY)
Admission: EM | Admit: 2016-08-16 | Discharge: 2016-08-16 | Disposition: A | Discharge status: Home / Self Care | Payer: Medicaid Other | Attending: Emergency Medicine | Admitting: Emergency Medicine

## 2016-08-16 VITALS — BP 117/68 | HR 106 | Ht 63.0 in | Wt 156.0 lb

## 2016-08-16 DIAGNOSIS — Y999 Unspecified external cause status: Secondary | ICD-10-CM | POA: Diagnosis not present

## 2016-08-16 DIAGNOSIS — E038 Other specified hypothyroidism: Secondary | ICD-10-CM

## 2016-08-16 DIAGNOSIS — Z87891 Personal history of nicotine dependence: Secondary | ICD-10-CM | POA: Insufficient documentation

## 2016-08-16 DIAGNOSIS — Y929 Unspecified place or not applicable: Secondary | ICD-10-CM | POA: Insufficient documentation

## 2016-08-16 DIAGNOSIS — E039 Hypothyroidism, unspecified: Secondary | ICD-10-CM | POA: Diagnosis not present

## 2016-08-16 DIAGNOSIS — Y939 Activity, unspecified: Secondary | ICD-10-CM | POA: Diagnosis not present

## 2016-08-16 DIAGNOSIS — Z79899 Other long term (current) drug therapy: Secondary | ICD-10-CM | POA: Insufficient documentation

## 2016-08-16 DIAGNOSIS — E063 Autoimmune thyroiditis: Secondary | ICD-10-CM | POA: Insufficient documentation

## 2016-08-16 DIAGNOSIS — S0081XA Abrasion of other part of head, initial encounter: Secondary | ICD-10-CM | POA: Diagnosis not present

## 2016-08-16 DIAGNOSIS — X58XXXA Exposure to other specified factors, initial encounter: Secondary | ICD-10-CM | POA: Insufficient documentation

## 2016-08-16 DIAGNOSIS — R55 Syncope and collapse: Secondary | ICD-10-CM | POA: Insufficient documentation

## 2016-08-16 DIAGNOSIS — S0990XA Unspecified injury of head, initial encounter: Secondary | ICD-10-CM | POA: Diagnosis present

## 2016-08-16 HISTORY — DX: Alcohol dependence, uncomplicated: F10.20

## 2016-08-16 LAB — CBC WITH DIFFERENTIAL/PLATELET
Basophils Absolute: 0 10*3/uL (ref 0.0–0.1)
Basophils Relative: 0 %
Eosinophils Absolute: 0.1 10*3/uL (ref 0.0–0.7)
Eosinophils Relative: 1 %
HCT: 38.9 % (ref 36.0–46.0)
Hemoglobin: 13 g/dL (ref 12.0–15.0)
Lymphocytes Relative: 29 %
Lymphs Abs: 2.5 10*3/uL (ref 0.7–4.0)
MCH: 31.3 pg (ref 26.0–34.0)
MCHC: 33.4 g/dL (ref 30.0–36.0)
MCV: 93.7 fL (ref 78.0–100.0)
Monocytes Absolute: 0.7 10*3/uL (ref 0.1–1.0)
Monocytes Relative: 8 %
Neutro Abs: 5.6 10*3/uL (ref 1.7–7.7)
Neutrophils Relative %: 62 %
Platelets: 269 10*3/uL (ref 150–400)
RBC: 4.15 MIL/uL (ref 3.87–5.11)
RDW: 12.8 % (ref 11.5–15.5)
WBC: 8.9 10*3/uL (ref 4.0–10.5)

## 2016-08-16 LAB — COMPREHENSIVE METABOLIC PANEL
ALT: 10 U/L — ABNORMAL LOW (ref 14–54)
AST: 15 U/L (ref 15–41)
Albumin: 3.4 g/dL — ABNORMAL LOW (ref 3.5–5.0)
Alkaline Phosphatase: 53 U/L (ref 38–126)
Anion gap: 7 (ref 5–15)
BUN: 8 mg/dL (ref 6–20)
CO2: 26 mmol/L (ref 22–32)
Calcium: 8.4 mg/dL — ABNORMAL LOW (ref 8.9–10.3)
Chloride: 110 mmol/L (ref 101–111)
Creatinine, Ser: 0.63 mg/dL (ref 0.44–1.00)
GFR calc Af Amer: >60 mL/min (ref >60)
GFR calc non Af Amer: >60 mL/min (ref >60)
Glucose, Bld: 94 mg/dL (ref 65–99)
Potassium: 3.7 mmol/L (ref 3.5–5.1)
Sodium: 143 mmol/L (ref 135–145)
Total Bilirubin: 0.4 mg/dL (ref 0.3–1.2)
Total Protein: 5.7 g/dL — ABNORMAL LOW (ref 6.5–8.1)

## 2016-08-16 LAB — URINALYSIS, ROUTINE W REFLEX MICROSCOPIC
Bilirubin Urine: NEGATIVE
Glucose, UA: NEGATIVE mg/dL
Ketones, ur: NEGATIVE mg/dL
Leukocytes, UA: NEGATIVE
Nitrite: NEGATIVE
Protein, ur: NEGATIVE mg/dL
Specific Gravity, Urine: 1.002 — ABNORMAL LOW (ref 1.005–1.030)
pH: 6 (ref 5.0–8.0)

## 2016-08-16 LAB — RAPID URINE DRUG SCREEN, HOSP PERFORMED
Amphetamines: NOT DETECTED
Barbiturates: NOT DETECTED
Benzodiazepines: NOT DETECTED
Cocaine: NOT DETECTED
Opiates: NOT DETECTED
Tetrahydrocannabinol: NOT DETECTED

## 2016-08-16 LAB — I-STAT BETA HCG BLOOD, ED (MC, WL, AP ONLY): I-stat hCG, quantitative: <5 m[IU]/mL (ref <5)

## 2016-08-16 LAB — ETHANOL: Alcohol, Ethyl (B): 299 mg/dL — ABNORMAL HIGH (ref <5)

## 2016-08-16 MED ORDER — SODIUM CHLORIDE 0.9 % IV BOLUS (SEPSIS)
1000.0000 mL | Freq: Once | INTRAVENOUS | Status: AC
  Administered 2016-08-16: 1000 mL via INTRAVENOUS
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
## 2016-08-16 NOTE — ED Triage Notes (Signed)
Pt had near syncope episode at endocrinologist office. Per ems pt is intoxicated

## 2016-08-16 NOTE — Discharge Instructions (Signed)
Follow-up at day [facility] for help with her drinking. Follow-up with your family doctor for recheck next week
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
## 2016-08-16 NOTE — ED Provider Notes (Signed)
[facility] DEPT Provider Note   CSN: [id number] Arrival date & time: 08/16/16  1257     History   Chief Complaint Chief Complaint  Patient presents with  . Loss of Consciousness    HPI Ebony Jones is a 46 y.o. female.  Patient states that she passed out and hit her head. Patient states she's been drinking   The history is provided by the patient. No language interpreter was used.  Loss of Consciousness   This is a recurrent problem. The current episode started 1 to 2 hours ago. The problem occurs rarely. The problem has been resolved. She lost consciousness for a period of less than one minute. The problem is associated with normal activity. Pertinent negatives include abdominal pain, back pain, chest pain, congestion, headaches, seizures and visual change.    Past Medical History:  Diagnosis Date  . Alcoholism ([place])   . Thyroid disease     Patient Active Problem List   Diagnosis Date Noted  . Hashimoto's thyroiditis 08/16/2016  . Hypothyroidism 05/22/2016    History reviewed. No pertinent surgical history.  OB History    Gravida Para Term Preterm AB Living   3 3       3    SAB TAB Ectopic Multiple Live Births                   Home Medications    Prior to Admission medications   Medication Sig Start Date End Date Taking? Authorizing Provider  levothyroxine (SYNTHROID, LEVOTHROID) 125 MCG tablet Take 1 tablet (125 mcg total) by mouth daily before breakfast. 05/30/16   Cassandria Anger, MD  sertraline (ZOLOFT) 50 MG tablet Take 50 mg by mouth daily.    Historical Provider, MD    Family History History reviewed. No pertinent family history.  Social History Social History  Substance Use Topics  . Smoking status: Former Research scientist (life sciences)  . Smokeless tobacco: Never Used  . Alcohol use Yes     Comment: 1 pint today. drinks daily liqour      Allergies   Codeine; Hydrocodone; and Penicillins   Review of Systems Review of Systems  Constitutional:  Negative for appetite change and fatigue.  HENT: Negative for congestion, ear discharge and sinus pressure.   Eyes: Negative for discharge.  Respiratory: Negative for cough.   Cardiovascular: Positive for syncope. Negative for chest pain.  Gastrointestinal: Negative for abdominal pain and diarrhea.  Genitourinary: Negative for frequency and hematuria.  Musculoskeletal: Negative for back pain.  Skin: Negative for rash.  Neurological: Positive for syncope. Negative for seizures and headaches.  Psychiatric/Behavioral: Negative for hallucinations.     Physical Exam Updated Vital Signs BP 108/68 (BP Location: Left Arm)   Pulse (!) 121   Temp 97.8 F (36.6 C) (Oral)   Resp 18   Ht 5\' 3"  (1.6 m)   Wt 156 lb (70.8 kg)   LMP 08/02/2016   SpO2 97%   BMI 27.63 kg/m   Physical Exam  Constitutional: She is oriented to person, place, and time. She appears well-developed.  HENT:  Head: Normocephalic.  Abrasion left forehead  Eyes: Conjunctivae and EOM are normal. No scleral icterus.  Neck: Neck supple. No thyromegaly present.  Cardiovascular: Normal rate and regular rhythm.  Exam reveals no gallop and no friction rub.   No murmur heard. Pulmonary/Chest: No stridor. She has no wheezes. She has no rales. She exhibits no tenderness.  Abdominal: She exhibits no distension. There is no tenderness. There is no rebound.  Musculoskeletal: Normal range of motion. She exhibits no edema.  Lymphadenopathy:    She has no cervical adenopathy.  Neurological: She is oriented to person, place, and time. She exhibits normal muscle tone. Coordination normal.  Skin: No rash noted. No erythema.  Psychiatric: She has a normal mood and affect. Her behavior is normal.     ED Treatments / Results  Labs (all labs ordered are listed, but only abnormal results are displayed) Labs Reviewed  URINALYSIS, ROUTINE W REFLEX MICROSCOPIC - Abnormal; Notable for the following:       Result Value   Color, Urine  COLORLESS (*)    Specific Gravity, Urine 1.002 (*)    Hgb urine dipstick SMALL (*)    Bacteria, UA RARE (*)    All other components within normal limits  COMPREHENSIVE METABOLIC PANEL - Abnormal; Notable for the following:    Calcium 8.4 (*)    Total Protein 5.7 (*)    Albumin 3.4 (*)    ALT 10 (*)    All other components within normal limits  ETHANOL - Abnormal; Notable for the following:    Alcohol, Ethyl (B) 299 (*)    All other components within normal limits  RAPID URINE DRUG SCREEN, HOSP PERFORMED  CBC WITH DIFFERENTIAL/PLATELET  I-STAT BETA HCG BLOOD, ED ([facility], [facility], [facility] ONLY)    EKG  EKG Interpretation None       Radiology Ct Head Wo Contrast  Result Date: 08/16/2016 CLINICAL DATA:  Fall after syncope. EXAM: CT HEAD WITHOUT CONTRAST CT CERVICAL SPINE WITHOUT CONTRAST TECHNIQUE: Multidetector CT imaging of the head and cervical spine was performed following the standard protocol without intravenous contrast. Multiplanar CT image reconstructions of the cervical spine were also generated. COMPARISON:  None. FINDINGS: CT HEAD FINDINGS Brain: No evidence of acute infarction, hemorrhage, hydrocephalus, extra-axial collection or mass lesion/mass effect. Vascular: No hyperdense vessel or unexpected calcification. Skull: Normal. Negative for fracture or focal lesion. Sinuses/Orbits: Left maxillary, ethmoid and frontal sinusitis is noted. Other: None. CT CERVICAL SPINE FINDINGS Alignment: Normal. Skull base and vertebrae: No acute fracture. No primary bone lesion or focal pathologic process. Soft tissues and spinal canal: No prevertebral fluid or swelling. No visible canal hematoma. Disc levels:  Normal. Upper chest: Negative. Other: None. IMPRESSION: Normal head CT. Normal cervical spine. Electronically Signed   By: Marijo Conception, M.D.   On: 08/16/2016 15:27   Ct Cervical Spine Wo Contrast  Result Date: 08/16/2016 CLINICAL DATA:  Fall after syncope. EXAM: CT HEAD WITHOUT CONTRAST CT  CERVICAL SPINE WITHOUT CONTRAST TECHNIQUE: Multidetector CT imaging of the head and cervical spine was performed following the standard protocol without intravenous contrast. Multiplanar CT image reconstructions of the cervical spine were also generated. COMPARISON:  None. FINDINGS: CT HEAD FINDINGS Brain: No evidence of acute infarction, hemorrhage, hydrocephalus, extra-axial collection or mass lesion/mass effect. Vascular: No hyperdense vessel or unexpected calcification. Skull: Normal. Negative for fracture or focal lesion. Sinuses/Orbits: Left maxillary, ethmoid and frontal sinusitis is noted. Other: None. CT CERVICAL SPINE FINDINGS Alignment: Normal. Skull base and vertebrae: No acute fracture. No primary bone lesion or focal pathologic process. Soft tissues and spinal canal: No prevertebral fluid or swelling. No visible canal hematoma. Disc levels:  Normal. Upper chest: Negative. Other: None. IMPRESSION: Normal head CT. Normal cervical spine. Electronically Signed   By: Marijo Conception, M.D.   On: 08/16/2016 15:27   Dg Abd Acute W/chest  Result Date: 08/16/2016 CLINICAL DATA:  Abdominal pain.  Former smoker. EXAM:  DG ABDOMEN ACUTE W/ 1V CHEST COMPARISON:  Ultrasound 04/19/2015.  CT 04/12/2006. FINDINGS: The heart size and mediastinal contours are normal. The lungs are clear. There is no pleural effusion or pneumothorax. No acute osseous findings are identified. The bowel gas pattern is normal. There is no free intraperitoneal air or suspicious calcification. Small pelvic calcifications are likely phleboliths. No acute osseous findings are seen. Numerous telemetry leads overlie the chest and abdomen. IMPRESSION: No evidence of active cardiopulmonary or abdominal process. Electronically Signed   By: Richardean Sale M.D.   On: 08/16/2016 15:33    Procedures Procedures (including critical care time)  Medications Ordered in ED Medications  sodium chloride 0.9 % bolus 1,000 mL (1,000 mLs Intravenous New  Bag/Given 08/16/16 1329)     Initial Impression / Assessment and Plan / ED Course  I have reviewed the triage vital signs and the nursing notes.  Pertinent labs & imaging results that were available during my care of the patient were reviewed by me and considered in my medical decision making (see chart for details).  Clinical Course     X-rays and labs were unremarkable except for alcohol level was 300. Patient will be discharged home follow-up with her family doctor and she states she's going to day [facility] [place] to help with her alcohol abuse  Final Clinical Impressions(s) / ED Diagnoses   Final diagnoses:  Syncope and collapse    New Prescriptions New Prescriptions   No medications on file     Milton Ferguson, MD 08/16/16 1553

## 2016-08-17 ENCOUNTER — Emergency Department (HOSPITAL_COMMUNITY)
Admission: EM | Admit: 2016-08-17 | Discharge: 2016-08-17 | Disposition: A | Discharge status: Home / Self Care | Payer: Medicaid Other | Attending: Emergency Medicine | Admitting: Emergency Medicine

## 2016-08-17 ENCOUNTER — Encounter (HOSPITAL_COMMUNITY): Payer: Self-pay | Admitting: *Deleted

## 2016-08-17 DIAGNOSIS — Z87891 Personal history of nicotine dependence: Secondary | ICD-10-CM | POA: Insufficient documentation

## 2016-08-17 DIAGNOSIS — F1010 Alcohol abuse, uncomplicated: Secondary | ICD-10-CM | POA: Diagnosis present

## 2016-08-17 DIAGNOSIS — E039 Hypothyroidism, unspecified: Secondary | ICD-10-CM | POA: Insufficient documentation

## 2016-08-17 LAB — RAPID URINE DRUG SCREEN, HOSP PERFORMED
Amphetamines: NOT DETECTED
Barbiturates: NOT DETECTED
Benzodiazepines: NOT DETECTED
Cocaine: NOT DETECTED
Opiates: NOT DETECTED
Tetrahydrocannabinol: NOT DETECTED

## 2016-08-17 LAB — COMPREHENSIVE METABOLIC PANEL
ALT: 10 U/L — ABNORMAL LOW (ref 14–54)
AST: 18 U/L (ref 15–41)
Albumin: 3.6 g/dL (ref 3.5–5.0)
Alkaline Phosphatase: 51 U/L (ref 38–126)
Anion gap: 7 (ref 5–15)
BUN: 9 mg/dL (ref 6–20)
CO2: 26 mmol/L (ref 22–32)
Calcium: 8.1 mg/dL — ABNORMAL LOW (ref 8.9–10.3)
Chloride: 103 mmol/L (ref 101–111)
Creatinine, Ser: 0.54 mg/dL (ref 0.44–1.00)
GFR calc Af Amer: >60 mL/min (ref >60)
GFR calc non Af Amer: >60 mL/min (ref >60)
Glucose, Bld: 86 mg/dL (ref 65–99)
Potassium: 3.7 mmol/L (ref 3.5–5.1)
Sodium: 136 mmol/L (ref 135–145)
Total Bilirubin: 0.1 mg/dL — ABNORMAL LOW (ref 0.3–1.2)
Total Protein: 6.1 g/dL — ABNORMAL LOW (ref 6.5–8.1)

## 2016-08-17 LAB — CBC
HCT: 38.1 % (ref 36.0–46.0)
Hemoglobin: 12.6 g/dL (ref 12.0–15.0)
MCH: 31 pg (ref 26.0–34.0)
MCHC: 33.1 g/dL (ref 30.0–36.0)
MCV: 93.6 fL (ref 78.0–100.0)
Platelets: 292 10*3/uL (ref 150–400)
RBC: 4.07 MIL/uL (ref 3.87–5.11)
RDW: 12.9 % (ref 11.5–15.5)
WBC: 8.4 10*3/uL (ref 4.0–10.5)

## 2016-08-17 LAB — ETHANOL: Alcohol, Ethyl (B): 118 mg/dL — ABNORMAL HIGH (ref <5)

## 2016-08-17 NOTE — Discharge Instructions (Signed)
Please take these results to the detox center You may return for worsening symptoms to the ER

## 2016-08-17 NOTE — ED Triage Notes (Signed)
Pt was sent here by [facility] to get blood work done so she can be admitted to that facility for alcohol detox. Pt denies any symptoms at this time. States he last drink was a couple of hours ago.

## 2016-08-17 NOTE — ED Provider Notes (Signed)
[facility] DEPT Provider Note   CSN: [id number] Arrival date & time: 08/17/16  1334     History   Chief Complaint Chief Complaint  Patient presents with  . Medical Clearance    HPI Arlie Frohn is a 46 y.o. female.  HPI   The patient is a 46 year old female, she does have a history of alcoholism as well as hypothyroidism. She states that she drinks over a fifth of liquor a day, she states this is normal for her, she has been drinking for the last 11 months but does have a history in the past of heavy alcohol use and has been to detox multiple times. She states that she is here because she wants to have blood work done to clear her to go to detox. She does not feel medically ill at this time. She has no medical complaints, she does not feel shaky and has not had any seizures or abnormal mental status. Her last drink was a proximally 6 hours ago.  Past Medical History:  Diagnosis Date  . Alcoholism ([place])   . Thyroid disease     Patient Active Problem List   Diagnosis Date Noted  . Hashimoto's thyroiditis 08/16/2016  . Hypothyroidism 05/22/2016    History reviewed. No pertinent surgical history.  OB History    Gravida Para Term Preterm AB Living   3 3       3    SAB TAB Ectopic Multiple Live Births                   Home Medications    Prior to Admission medications   Medication Sig Start Date End Date Taking? Authorizing Provider  levothyroxine (SYNTHROID, LEVOTHROID) 125 MCG tablet Take 1 tablet (125 mcg total) by mouth daily before breakfast. 05/30/16  Yes Cassandria Anger, MD  oxymetazoline (AFRIN) 0.05 % nasal spray Place 1 spray into both nostrils 2 (two) times daily as needed for congestion.   Yes Historical Provider, MD    Family History No family history on file.  Social History Social History  Substance Use Topics  . Smoking status: Former Research scientist (life sciences)  . Smokeless tobacco: Never Used  . Alcohol use Yes     Comment: 1 pint today. drinks daily  liqour      Allergies   Codeine; Hydrocodone; and Penicillins   Review of Systems Review of Systems  All other systems reviewed and are negative.    Physical Exam Updated Vital Signs BP 122/69 (BP Location: Left Arm)   Pulse 115   Temp 98.6 F (37 C) (Oral)   Resp 16   Ht 5\' 4"  (1.626 m)   Wt 145 lb (65.8 kg)   LMP 08/02/2016   SpO2 97%   BMI 24.89 kg/m   Physical Exam  Constitutional: She appears well-developed and well-nourished. No distress.  HENT:  Head: Normocephalic and atraumatic.  Mouth/Throat: Oropharynx is clear and moist. No oropharyngeal exudate.  Eyes: Conjunctivae and EOM are normal. Pupils are equal, round, and reactive to light. Right eye exhibits no discharge. Left eye exhibits no discharge. No scleral icterus.  Neck: Normal range of motion. Neck supple. No JVD present. No thyromegaly present.  Cardiovascular: Normal rate, regular rhythm, normal heart sounds and intact distal pulses.  Exam reveals no gallop and no friction rub.   No murmur heard. Pulmonary/Chest: Effort normal and breath sounds normal. No respiratory distress. She has no wheezes. She has no rales.  Abdominal: Soft. Bowel sounds are normal. She exhibits no  distension and no mass. There is no tenderness.  Musculoskeletal: Normal range of motion. She exhibits no edema or tenderness.  Lymphadenopathy:    She has no cervical adenopathy.  Neurological: She is alert. Coordination normal.  Skin: Skin is warm and dry. No rash noted. No erythema.  Psychiatric: She has a normal mood and affect. Her behavior is normal.  Nursing note and vitals reviewed.    ED Treatments / Results  Labs (all labs ordered are listed, but only abnormal results are displayed) Labs Reviewed  COMPREHENSIVE METABOLIC PANEL - Abnormal; Notable for the following:       Result Value   Calcium 8.1 (*)    Total Protein 6.1 (*)    ALT 10 (*)    Total Bilirubin 0.1 (*)    All other components within normal limits    ETHANOL - Abnormal; Notable for the following:    Alcohol, Ethyl (B) 118 (*)    All other components within normal limits  CBC  RAPID URINE DRUG SCREEN, HOSP PERFORMED     Radiology Ct Head Wo Contrast  Result Date: 08/16/2016 CLINICAL DATA:  Fall after syncope. EXAM: CT HEAD WITHOUT CONTRAST CT CERVICAL SPINE WITHOUT CONTRAST TECHNIQUE: Multidetector CT imaging of the head and cervical spine was performed following the standard protocol without intravenous contrast. Multiplanar CT image reconstructions of the cervical spine were also generated. COMPARISON:  None. FINDINGS: CT HEAD FINDINGS Brain: No evidence of acute infarction, hemorrhage, hydrocephalus, extra-axial collection or mass lesion/mass effect. Vascular: No hyperdense vessel or unexpected calcification. Skull: Normal. Negative for fracture or focal lesion. Sinuses/Orbits: Left maxillary, ethmoid and frontal sinusitis is noted. Other: None. CT CERVICAL SPINE FINDINGS Alignment: Normal. Skull base and vertebrae: No acute fracture. No primary bone lesion or focal pathologic process. Soft tissues and spinal canal: No prevertebral fluid or swelling. No visible canal hematoma. Disc levels:  Normal. Upper chest: Negative. Other: None. IMPRESSION: Normal head CT. Normal cervical spine. Electronically Signed   By: Marijo Conception, M.D.   On: 08/16/2016 15:27   Ct Cervical Spine Wo Contrast  Result Date: 08/16/2016 CLINICAL DATA:  Fall after syncope. EXAM: CT HEAD WITHOUT CONTRAST CT CERVICAL SPINE WITHOUT CONTRAST TECHNIQUE: Multidetector CT imaging of the head and cervical spine was performed following the standard protocol without intravenous contrast. Multiplanar CT image reconstructions of the cervical spine were also generated. COMPARISON:  None. FINDINGS: CT HEAD FINDINGS Brain: No evidence of acute infarction, hemorrhage, hydrocephalus, extra-axial collection or mass lesion/mass effect. Vascular: No hyperdense vessel or unexpected  calcification. Skull: Normal. Negative for fracture or focal lesion. Sinuses/Orbits: Left maxillary, ethmoid and frontal sinusitis is noted. Other: None. CT CERVICAL SPINE FINDINGS Alignment: Normal. Skull base and vertebrae: No acute fracture. No primary bone lesion or focal pathologic process. Soft tissues and spinal canal: No prevertebral fluid or swelling. No visible canal hematoma. Disc levels:  Normal. Upper chest: Negative. Other: None. IMPRESSION: Normal head CT. Normal cervical spine. Electronically Signed   By: Marijo Conception, M.D.   On: 08/16/2016 15:27   Dg Abd Acute W/chest  Result Date: 08/16/2016 CLINICAL DATA:  Abdominal pain.  Former smoker. EXAM: DG ABDOMEN ACUTE W/ 1V CHEST COMPARISON:  Ultrasound 04/19/2015.  CT 04/12/2006. FINDINGS: The heart size and mediastinal contours are normal. The lungs are clear. There is no pleural effusion or pneumothorax. No acute osseous findings are identified. The bowel gas pattern is normal. There is no free intraperitoneal air or suspicious calcification. Small pelvic calcifications are likely phleboliths.  No acute osseous findings are seen. Numerous telemetry leads overlie the chest and abdomen. IMPRESSION: No evidence of active cardiopulmonary or abdominal process. Electronically Signed   By: Richardean Sale M.D.   On: 08/16/2016 15:33    Procedures Procedures (including critical care time)  Medications Ordered in ED Medications - No data to display   Initial Impression / Assessment and Plan / ED Course  I have reviewed the triage vital signs and the nursing notes.  Pertinent labs & imaging results that were available during my care of the patient were reviewed by me and considered in my medical decision making (see chart for details).  Clinical Course     There is no tachycardia on my exam, she appears well, she appears stable to go to an inpatient detox facility. I have reviewed her labs, the patient is in agreement with the  plan    Final Clinical Impressions(s) / ED Diagnoses   Final diagnoses:  Alcohol abuse    New Prescriptions New Prescriptions   No medications on file     Noemi Chapel, MD 08/17/16 1803

## 2016-09-13 ENCOUNTER — Encounter: Payer: Self-pay | Admitting: "Endocrinology

## 2016-11-21 ENCOUNTER — Encounter: Payer: Medicaid Other | Admitting: "Endocrinology

## 2017-02-09 ENCOUNTER — Other Ambulatory Visit: Payer: Self-pay | Admitting: "Endocrinology

## 2017-03-21 ENCOUNTER — Other Ambulatory Visit: Payer: Self-pay | Admitting: "Endocrinology

## 2017-03-21 DIAGNOSIS — E039 Hypothyroidism, unspecified: Secondary | ICD-10-CM

## 2017-03-27 LAB — T4, FREE: Free T4: 1.6 ng/dL (ref 0.8–1.8)

## 2017-03-27 LAB — TSH: TSH: 0.06 mIU/L — ABNORMAL LOW

## 2017-03-28 ENCOUNTER — Encounter: Payer: Self-pay | Admitting: "Endocrinology

## 2017-03-28 ENCOUNTER — Ambulatory Visit (INDEPENDENT_AMBULATORY_CARE_PROVIDER_SITE_OTHER): Payer: Medicaid Other | Admitting: "Endocrinology

## 2017-03-28 VITALS — BP 109/72 | HR 76 | Ht 63.0 in | Wt 150.0 lb

## 2017-03-28 DIAGNOSIS — E038 Other specified hypothyroidism: Secondary | ICD-10-CM

## 2017-03-28 DIAGNOSIS — E063 Autoimmune thyroiditis: Secondary | ICD-10-CM

## 2017-03-28 MED ORDER — LEVOTHYROXINE SODIUM 112 MCG PO TABS: ORAL_TABLET | ORAL | 6 refills | Status: DC

## 2017-03-28 NOTE — Progress Notes (Signed)
Subjective:    Patient ID: Ebony Jones, female    DOB: November 08, 1970, PCP Ebony Downing, MD   Past Medical History:  Diagnosis Date  . Alcoholism ([place])   . Thyroid disease    History reviewed. No pertinent surgical history. Social History   Social History  . Marital status: Divorced    Spouse name: N/A  . Number of children: N/A  . Years of education: N/A   Social History Main Topics  . Smoking status: Former Research scientist (life sciences)  . Smokeless tobacco: Never Used  . Alcohol use Yes     Comment:  drinks daily liqour   . Drug use: No  . Sexual activity: Not Asked   Other Topics Concern  . None   Social History Narrative  . None   Outpatient Encounter Prescriptions as of 03/28/2017  Medication Sig  . escitalopram (LEXAPRO) 20 MG tablet Take 20 mg by mouth daily.  Marland Kitchen levothyroxine (SYNTHROID, LEVOTHROID) 112 MCG tablet TAKE ONE TABLET BY MOUTH ONCE DAILY BEFORE BREAKFAST  . [DISCONTINUED] levothyroxine (SYNTHROID, LEVOTHROID) 125 MCG tablet TAKE ONE TABLET BY MOUTH ONCE DAILY BEFORE BREAKFAST  . [DISCONTINUED] oxymetazoline (AFRIN) 0.05 % nasal spray Place 1 spray into both nostrils 2 (two) times daily as needed for congestion.   No facility-administered encounter medications on file as of 03/28/2017.    ALLERGIES: Allergies  Allergen Reactions  . Codeine   . Hydrocodone Rash  . Penicillins Rash    Has patient had a PCN reaction causing immediate rash, facial/tongue/throat swelling, SOB or lightheadedness with hypotension: Yes Has patient had a PCN reaction causing severe rash involving mucus membranes or skin necrosis: No Has patient had a PCN reaction that required hospitalization No Has patient had a PCN reaction occurring within the last 10 years: No  If all of the above answers are "NO", then may proceed with Cephalosporin use.    VACCINATION STATUS:  There is no immunization history on file for this patient.  HPI  47 year old female patient with medical  history as above. She is being seen in f/u  for hypothyroidism. - She was diagnosed with hypothyroidism 4 years ago, due to Hashimoto's thyroiditis.  She denies history of thyroidectomy nor radioactive iodine therapy. - She is currently on levothyroxine 125 g by mouth every morning.  - She complains of palpitations, heat intolerance associated with nocturnal  diaphoresis. - She takes medication for depression/anxiety. - She has family history of unidentified thyroid problem in her father and one of her aunts. - She reports episodes of mood swings, anxiety, irritability.  She denies palpitations. She denies heat or cold intolerance.   Review of Systems  Constitutional: + weight loss tics pounds,  no fatigue, no subjective hyperthermia/hypothermia Eyes: no blurry vision, no xerophthalmia ENT: no sore throat, no nodules palpated in throat, no dysphagia/odynophagia, no hoarseness Cardiovascular: no CP/SOB/palpitations/leg swelling Respiratory: no cough/SOB Gastrointestinal: no N/V/D/C Musculoskeletal: no muscle/joint aches Skin: no rashes Neurological: no tremors,  - numbness, - tingling/dizziness Psychiatric:  + depression,  +anxiety  Objective:    BP 109/72   Pulse 76   Ht 5\' 3"  (1.6 m)   Wt 150 lb (68 kg)   BMI 26.57 kg/m   Wt Readings from Last 3 Encounters:  03/28/17 150 lb (68 kg)  08/17/16 145 lb (65.8 kg)  08/16/16 156 lb (70.8 kg)    Physical Exam Constitutional:  Stable state of mind , NAD. Eyes: PERRLA, EOMI, no exophthalmos ENT: moist mucous membranes, no thyromegaly, no cervical  lymphadenopathy Cardiovascular: Tachycardic, No MRG  tremor with outstretched hands, DTR normal in all 4  CMP ( most recent) CMP     Component Value Date/Time   NA 136 08/17/2016 1641   K 3.7 08/17/2016 1641   CL 103 08/17/2016 1641   CO2 26 08/17/2016 1641   GLUCOSE 86 08/17/2016 1641   BUN 9 08/17/2016 1641   CREATININE 0.54 08/17/2016 1641   CALCIUM 8.1 (L) 08/17/2016 1641    PROT 6.1 (L) 08/17/2016 1641   ALBUMIN 3.6 08/17/2016 1641   AST 18 08/17/2016 1641   ALT 10 (L) 08/17/2016 1641   ALKPHOS 51 08/17/2016 1641   BILITOT 0.1 (L) 08/17/2016 1641   GFRNONAA >60 08/17/2016 1641   GFRAA >60 08/17/2016 1641   Results for Ebony Jones (MRN [id number]) as of 03/28/2017 14:45  Ref. Range 03/26/2017 11:03  TSH Latest Units: mIU/L 0.06 (L)  T4,Free(Direct) Latest Ref Range: 0.8 - 1.8 ng/dL 1.6    Assessment & Plan:   1. Hypothyroidism 2. Hashimoto's thyroiditis  - Patient has well established long-standing hypothyroidism due to Hashimoto's thyroiditis -  her  thyroid function tests are consistent with slight over replacement.  I will proceed to lower her levothyroxine to 112  g by mouth every morning.   - We discussed about correct intake of levothyroxine, at fasting, with water, separated by at least 30 minutes from breakfast, and separated by more than 4 hours from calcium, iron, multivitamins, acid reflux medications (PPIs). -Patient is made aware of the fact that thyroid hormone replacement is needed for life, dose to be adjusted by periodic monitoring of thyroid function tests.  -  She has history of Hashimoto's thyroiditis, she does not have clinical goiter hence no need for imaging studies at this time.  - I advised patient to maintain close follow up with Ebony Downing, MD for primary care needs. Follow up plan: Return in about 3 months (around 06/28/2017) for follow up with pre-visit labs.  Ebony Lloyd, MD Phone: [phone]  Fax: [phone]   03/28/2017, 2:54 PM

## 2017-04-04 ENCOUNTER — Ambulatory Visit: Payer: Medicaid Other | Admitting: "Endocrinology

## 2017-04-11 ENCOUNTER — Ambulatory Visit: Payer: Medicaid Other | Admitting: Family Medicine

## 2017-06-24 ENCOUNTER — Other Ambulatory Visit: Payer: Self-pay

## 2017-06-24 ENCOUNTER — Inpatient Hospital Stay (HOSPITAL_COMMUNITY)
Admission: EM | Admit: 2017-06-24 | Discharge: 2017-07-03 | DRG: 082 | Disposition: A | Discharge status: Rehabilitation Facility | Payer: No Typology Code available for payment source | Attending: Physician Assistant | Admitting: Physician Assistant

## 2017-06-24 ENCOUNTER — Inpatient Hospital Stay (HOSPITAL_COMMUNITY): Payer: No Typology Code available for payment source

## 2017-06-24 ENCOUNTER — Emergency Department (HOSPITAL_COMMUNITY): Payer: No Typology Code available for payment source

## 2017-06-24 ENCOUNTER — Encounter (HOSPITAL_COMMUNITY): Payer: Self-pay | Admitting: Radiology

## 2017-06-24 DIAGNOSIS — S066X9A Traumatic subarachnoid hemorrhage with loss of consciousness of unspecified duration, initial encounter: Principal | ICD-10-CM | POA: Diagnosis present

## 2017-06-24 DIAGNOSIS — R40243 Glasgow coma scale score 3-8, unspecified time: Secondary | ICD-10-CM | POA: Diagnosis present

## 2017-06-24 DIAGNOSIS — H4902 Third [oculomotor] nerve palsy, left eye: Secondary | ICD-10-CM | POA: Diagnosis present

## 2017-06-24 DIAGNOSIS — Z880 Allergy status to penicillin: Secondary | ICD-10-CM

## 2017-06-24 DIAGNOSIS — F1210 Cannabis abuse, uncomplicated: Secondary | ICD-10-CM

## 2017-06-24 DIAGNOSIS — S062X9D Diffuse traumatic brain injury with loss of consciousness of unspecified duration, subsequent encounter: Secondary | ICD-10-CM | POA: Diagnosis not present

## 2017-06-24 DIAGNOSIS — S066X9D Traumatic subarachnoid hemorrhage with loss of consciousness of unspecified duration, subsequent encounter: Secondary | ICD-10-CM | POA: Diagnosis not present

## 2017-06-24 DIAGNOSIS — S062X3S Diffuse traumatic brain injury with loss of consciousness of 1 hour to 5 hours 59 minutes, sequela: Secondary | ICD-10-CM | POA: Diagnosis not present

## 2017-06-24 DIAGNOSIS — S062X9A Diffuse traumatic brain injury with loss of consciousness of unspecified duration, initial encounter: Secondary | ICD-10-CM

## 2017-06-24 DIAGNOSIS — Y92410 Unspecified street and highway as the place of occurrence of the external cause: Secondary | ICD-10-CM

## 2017-06-24 DIAGNOSIS — S069X9S Unspecified intracranial injury with loss of consciousness of unspecified duration, sequela: Secondary | ICD-10-CM | POA: Diagnosis not present

## 2017-06-24 DIAGNOSIS — F0280 Dementia in other diseases classified elsewhere without behavioral disturbance: Secondary | ICD-10-CM | POA: Diagnosis not present

## 2017-06-24 DIAGNOSIS — I609 Nontraumatic subarachnoid hemorrhage, unspecified: Secondary | ICD-10-CM | POA: Diagnosis present

## 2017-06-24 DIAGNOSIS — R1310 Dysphagia, unspecified: Secondary | ICD-10-CM

## 2017-06-24 DIAGNOSIS — J9690 Respiratory failure, unspecified, unspecified whether with hypoxia or hypercapnia: Secondary | ICD-10-CM

## 2017-06-24 DIAGNOSIS — F17290 Nicotine dependence, other tobacco product, uncomplicated: Secondary | ICD-10-CM | POA: Diagnosis present

## 2017-06-24 DIAGNOSIS — E039 Hypothyroidism, unspecified: Secondary | ICD-10-CM | POA: Diagnosis present

## 2017-06-24 DIAGNOSIS — D62 Acute posthemorrhagic anemia: Secondary | ICD-10-CM | POA: Diagnosis not present

## 2017-06-24 DIAGNOSIS — S069X9A Unspecified intracranial injury with loss of consciousness of unspecified duration, initial encounter: Secondary | ICD-10-CM | POA: Diagnosis present

## 2017-06-24 DIAGNOSIS — F0281 Dementia in other diseases classified elsewhere with behavioral disturbance: Secondary | ICD-10-CM | POA: Diagnosis not present

## 2017-06-24 DIAGNOSIS — J9601 Acute respiratory failure with hypoxia: Secondary | ICD-10-CM | POA: Diagnosis present

## 2017-06-24 DIAGNOSIS — R270 Ataxia, unspecified: Secondary | ICD-10-CM | POA: Diagnosis present

## 2017-06-24 DIAGNOSIS — S069X9D Unspecified intracranial injury with loss of consciousness of unspecified duration, subsequent encounter: Secondary | ICD-10-CM | POA: Diagnosis not present

## 2017-06-24 DIAGNOSIS — G479 Sleep disorder, unspecified: Secondary | ICD-10-CM | POA: Diagnosis not present

## 2017-06-24 DIAGNOSIS — S069XAA Unspecified intracranial injury with loss of consciousness status unknown, initial encounter: Secondary | ICD-10-CM | POA: Diagnosis present

## 2017-06-24 DIAGNOSIS — Z978 Presence of other specified devices: Secondary | ICD-10-CM

## 2017-06-24 DIAGNOSIS — Z885 Allergy status to narcotic agent status: Secondary | ICD-10-CM

## 2017-06-24 DIAGNOSIS — T1490XA Injury, unspecified, initial encounter: Secondary | ICD-10-CM

## 2017-06-24 DIAGNOSIS — S062X3D Diffuse traumatic brain injury with loss of consciousness of 1 hour to 5 hours 59 minutes, subsequent encounter: Secondary | ICD-10-CM | POA: Diagnosis not present

## 2017-06-24 HISTORY — DX: Strain of unspecified muscle, fascia and tendon at shoulder and upper arm level, left arm, initial encounter: S46.912A

## 2017-06-24 HISTORY — DX: Alcohol abuse, uncomplicated: F10.10

## 2017-06-24 HISTORY — DX: Anxiety disorder, unspecified: F41.9

## 2017-06-24 HISTORY — DX: Hypothyroidism, unspecified: E03.9

## 2017-06-24 LAB — COMPREHENSIVE METABOLIC PANEL
ALT: 22 U/L (ref 14–54)
AST: 29 U/L (ref 15–41)
Albumin: 4.1 g/dL (ref 3.5–5.0)
Alkaline Phosphatase: 46 U/L (ref 38–126)
Anion gap: 9 (ref 5–15)
BUN: 12 mg/dL (ref 6–20)
CO2: 21 mmol/L — ABNORMAL LOW (ref 22–32)
Calcium: 8.8 mg/dL — ABNORMAL LOW (ref 8.9–10.3)
Chloride: 107 mmol/L (ref 101–111)
Creatinine, Ser: 0.74 mg/dL (ref 0.44–1.00)
GFR calc Af Amer: 58 mL/min — ABNORMAL LOW (ref >60)
GFR calc non Af Amer: 50 mL/min — ABNORMAL LOW (ref >60)
Glucose, Bld: 107 mg/dL — ABNORMAL HIGH (ref 65–99)
Potassium: 3.9 mmol/L (ref 3.5–5.1)
Sodium: 137 mmol/L (ref 135–145)
Total Bilirubin: 0.8 mg/dL (ref 0.3–1.2)
Total Protein: 6.7 g/dL (ref 6.5–8.1)

## 2017-06-24 LAB — I-STAT ARTERIAL BLOOD GAS, ED
Acid-base deficit: 2 mmol/L (ref 0.0–2.0)
Bicarbonate: 21.7 mmol/L (ref 20.0–28.0)
O2 Saturation: 100 %
Patient temperature: 36.3
TCO2: 23 mmol/L (ref 22–32)
pCO2 arterial: 32.1 mmHg (ref 32.0–48.0)
pH, Arterial: 7.434 (ref 7.350–7.450)
pO2, Arterial: 403 mmHg — ABNORMAL HIGH (ref 83.0–108.0)

## 2017-06-24 LAB — BPAM RBC
Blood Product Expiration Date: 201811232359
Blood Product Expiration Date: 201811252359
ISSUE DATE / TIME: 201811181147
ISSUE DATE / TIME: 201811181147
Unit Type and Rh: 9500
Unit Type and Rh: 9500

## 2017-06-24 LAB — LACTIC ACID, PLASMA: Lactic Acid, Venous: 2.5 mmol/L (ref 0.5–1.9)

## 2017-06-24 LAB — BPAM FFP
Blood Product Expiration Date: 201811182359
Blood Product Expiration Date: 201811222359
ISSUE DATE / TIME: 201811181148
ISSUE DATE / TIME: 201811181148
Unit Type and Rh: 6200
Unit Type and Rh: 6200

## 2017-06-24 LAB — TYPE AND SCREEN
ABO/RH(D): B NEG
Antibody Screen: NEGATIVE
Unit division: 0
Unit division: 0

## 2017-06-24 LAB — URINALYSIS, ROUTINE W REFLEX MICROSCOPIC
Bacteria, UA: NONE SEEN
Bilirubin Urine: NEGATIVE
Glucose, UA: NEGATIVE mg/dL
Ketones, ur: 20 mg/dL — AB
Leukocytes, UA: NEGATIVE
Nitrite: NEGATIVE
Protein, ur: 30 mg/dL — AB
Specific Gravity, Urine: >1.046 — ABNORMAL HIGH (ref 1.005–1.030)
pH: 6 (ref 5.0–8.0)

## 2017-06-24 LAB — CBC
HCT: 37.7 % (ref 36.0–46.0)
Hemoglobin: 12.5 g/dL (ref 12.0–15.0)
MCH: 29.8 pg (ref 26.0–34.0)
MCHC: 33.2 g/dL (ref 30.0–36.0)
MCV: 89.8 fL (ref 78.0–100.0)
Platelets: 193 10*3/uL (ref 150–400)
RBC: 4.2 MIL/uL (ref 3.87–5.11)
RDW: 12.5 % (ref 11.5–15.5)
WBC: 8.9 10*3/uL (ref 4.0–10.5)

## 2017-06-24 LAB — PREPARE FRESH FROZEN PLASMA
Unit division: 0
Unit division: 0

## 2017-06-24 LAB — PROTIME-INR
INR: 1.03
Prothrombin Time: 13.4 seconds (ref 11.4–15.2)

## 2017-06-24 LAB — I-STAT CHEM 8, ED
BUN: 17 mg/dL (ref 6–20)
Calcium, Ion: 1.14 mmol/L — ABNORMAL LOW (ref 1.15–1.40)
Chloride: 105 mmol/L (ref 101–111)
Creatinine, Ser: 0.6 mg/dL (ref 0.44–1.00)
Glucose, Bld: 106 mg/dL — ABNORMAL HIGH (ref 65–99)
HCT: 38 % (ref 36.0–46.0)
Hemoglobin: 12.9 g/dL (ref 12.0–15.0)
Potassium: 4.4 mmol/L (ref 3.5–5.1)
Sodium: 138 mmol/L (ref 135–145)
TCO2: 25 mmol/L (ref 22–32)

## 2017-06-24 LAB — CDS SEROLOGY

## 2017-06-24 LAB — I-STAT BETA HCG BLOOD, ED (MC, WL, AP ONLY): I-stat hCG, quantitative: <5 m[IU]/mL (ref <5)

## 2017-06-24 LAB — GLUCOSE, CAPILLARY: Glucose-Capillary: 94 mg/dL (ref 65–99)

## 2017-06-24 LAB — MRSA PCR SCREENING: MRSA by PCR: NEGATIVE

## 2017-06-24 LAB — RAPID URINE DRUG SCREEN, HOSP PERFORMED
Amphetamines: NOT DETECTED
Barbiturates: NOT DETECTED
Benzodiazepines: NOT DETECTED
Cocaine: NOT DETECTED
Opiates: NOT DETECTED
Tetrahydrocannabinol: POSITIVE — AB

## 2017-06-24 LAB — I-STAT CG4 LACTIC ACID, ED: Lactic Acid, Venous: 2.27 mmol/L (ref 0.5–1.9)

## 2017-06-24 LAB — ETHANOL: Alcohol, Ethyl (B): <10 mg/dL (ref <10)

## 2017-06-24 LAB — ABO/RH: ABO/RH(D): B NEG

## 2017-06-24 LAB — TRIGLYCERIDES: Triglycerides: 42 mg/dL (ref <150)

## 2017-06-24 LAB — CBG MONITORING, ED: Glucose-Capillary: 101 mg/dL — ABNORMAL HIGH (ref 65–99)

## 2017-06-24 MED ORDER — ONDANSETRON HCL 4 MG/2ML IJ SOLN
4.0000 mg | Freq: Four times a day (QID) | INTRAMUSCULAR | Status: DC | PRN
  Administered 2017-06-28: 4 mg via INTRAVENOUS
  Filled 2017-06-24: qty 2

## 2017-06-24 MED ORDER — ONDANSETRON 4 MG PO TBDP: 4.0000 mg | ORAL_TABLET | Freq: Four times a day (QID) | ORAL | Status: DC | PRN

## 2017-06-24 MED ORDER — FENTANYL CITRATE (PF) 100 MCG/2ML IJ SOLN
INTRAMUSCULAR | Status: AC
  Filled 2017-06-24: qty 2

## 2017-06-24 MED ORDER — NALOXONE HCL 0.4 MG/ML IJ SOLN
INTRAMUSCULAR | Status: AC | PRN
  Administered 2017-06-24: 0.4 mg via INTRAVENOUS

## 2017-06-24 MED ORDER — ROCURONIUM BROMIDE 50 MG/5ML IV SOLN
INTRAVENOUS | Status: AC | PRN
  Administered 2017-06-24: 70 mg via INTRAVENOUS

## 2017-06-24 MED ORDER — PROPOFOL 1000 MG/100ML IV EMUL: 5.0000 ug/kg/min | Freq: Once | INTRAVENOUS | Status: DC

## 2017-06-24 MED ORDER — PROPOFOL 1000 MG/100ML IV EMUL
0.0000 ug/kg/min | INTRAVENOUS | Status: DC
  Administered 2017-06-24: 25 ug/kg/min via INTRAVENOUS
  Administered 2017-06-24: 10 ug/kg/min via INTRAVENOUS
  Administered 2017-06-25 (×2): 30 ug/kg/min via INTRAVENOUS
  Administered 2017-06-25: 50 ug/kg/min via INTRAVENOUS
  Administered 2017-06-26 (×2): 30 ug/kg/min via INTRAVENOUS
  Administered 2017-06-27: 20 ug/kg/min via INTRAVENOUS
  Filled 2017-06-24 (×9): qty 100
  Filled 2017-06-24: qty 200

## 2017-06-24 MED ORDER — LEVETIRACETAM 500 MG/5ML IV SOLN
500.0000 mg | Freq: Two times a day (BID) | INTRAVENOUS | Status: DC
  Administered 2017-06-24 – 2017-06-30 (×12): 500 mg via INTRAVENOUS
  Filled 2017-06-24 (×13): qty 5

## 2017-06-24 MED ORDER — PROPOFOL 1000 MG/100ML IV EMUL
5.0000 ug/kg/min | Freq: Once | INTRAVENOUS | Status: DC
  Administered 2017-06-24: 5 ug/kg/min via INTRAVENOUS

## 2017-06-24 MED ORDER — PANTOPRAZOLE SODIUM 40 MG IV SOLR
40.0000 mg | Freq: Every day | INTRAVENOUS | Status: DC
  Administered 2017-06-24 – 2017-06-29 (×6): 40 mg via INTRAVENOUS
  Filled 2017-06-24 (×6): qty 40

## 2017-06-24 MED ORDER — ORAL CARE MOUTH RINSE
15.0000 mL | OROMUCOSAL | Status: DC
  Administered 2017-06-24 – 2017-06-28 (×35): 15 mL via OROMUCOSAL

## 2017-06-24 MED ORDER — CHLORHEXIDINE GLUCONATE 0.12% ORAL RINSE (MEDLINE KIT)
15.0000 mL | Freq: Two times a day (BID) | OROMUCOSAL | Status: DC
  Administered 2017-06-25 – 2017-06-30 (×10): 15 mL via OROMUCOSAL

## 2017-06-24 MED ORDER — ETOMIDATE 2 MG/ML IV SOLN
INTRAVENOUS | Status: AC | PRN
  Administered 2017-06-24: 21 mg via INTRAVENOUS

## 2017-06-24 MED ORDER — PANTOPRAZOLE SODIUM 40 MG PO TBEC
40.0000 mg | DELAYED_RELEASE_TABLET | Freq: Every day | ORAL | Status: DC
  Administered 2017-06-30 – 2017-07-03 (×4): 40 mg via ORAL
  Filled 2017-06-24 (×4): qty 1

## 2017-06-24 MED ORDER — IOPAMIDOL (ISOVUE-300) INJECTION 61%
INTRAVENOUS | Status: AC
  Administered 2017-06-24: 100 mL
  Filled 2017-06-24: qty 100

## 2017-06-24 MED ORDER — FENTANYL CITRATE (PF) 100 MCG/2ML IJ SOLN
100.0000 ug | INTRAMUSCULAR | Status: DC | PRN
  Filled 2017-06-24: qty 2

## 2017-06-24 MED ORDER — DOCUSATE SODIUM 50 MG/5ML PO LIQD: 100.0000 mg | Freq: Two times a day (BID) | ORAL | Status: DC | PRN

## 2017-06-24 MED ORDER — FENTANYL CITRATE (PF) 100 MCG/2ML IJ SOLN
100.0000 ug | INTRAMUSCULAR | Status: DC | PRN
  Administered 2017-06-26 – 2017-06-27 (×3): 100 ug via INTRAVENOUS
  Filled 2017-06-24 (×4): qty 2

## 2017-06-24 MED ORDER — PROPOFOL 1000 MG/100ML IV EMUL
INTRAVENOUS | Status: AC
  Administered 2017-06-24: 5 ug/kg/min via INTRAVENOUS
  Filled 2017-06-24: qty 100

## 2017-06-24 MED ORDER — SODIUM CHLORIDE 0.45 % IV SOLN
INTRAVENOUS | Status: DC
  Administered 2017-06-24 – 2017-06-27 (×6): via INTRAVENOUS

## 2017-06-24 MED ORDER — SODIUM CHLORIDE 0.9 % IV BOLUS (SEPSIS)
1000.0000 mL | Freq: Once | INTRAVENOUS | Status: AC
  Administered 2017-06-24: 1000 mL via INTRAVENOUS

## 2017-06-24 MED ORDER — FENTANYL CITRATE (PF) 100 MCG/2ML IJ SOLN
INTRAMUSCULAR | Status: AC | PRN
  Administered 2017-06-24: 25 ug via INTRAVENOUS

## 2017-06-24 NOTE — Progress Notes (Signed)
Pt transported to CT1 and back to TRB on vent. No complications

## 2017-06-24 NOTE — ED Notes (Signed)
Lab called stated urine received had wrong requisition form when sent. Stated to recollect and send new requisition form for urinalysis and urine drug screen. EMT notified and will sending new collection now with update name from "doe" name.
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
## 2017-06-24 NOTE — Progress Notes (Signed)
Patient transported to CT scan . 

## 2017-06-24 NOTE — ED Notes (Addendum)
Called CT they are ready in CT 1 - Nurse and RT transporting.

## 2017-06-24 NOTE — Progress Notes (Signed)
Patient back to 4N26 from CT scan without incident.
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
## 2017-06-24 NOTE — ED Notes (Signed)
Nurse and RT returned from radiology
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
## 2017-06-24 NOTE — ED Notes (Signed)
Trauma Doctor stated possible right eye enlarged due to previous history.

## 2017-06-24 NOTE — ED Notes (Signed)
Family "Alroy Dust" in waiting room.  Dr Hulen Skains paged and responded.
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
## 2017-06-24 NOTE — ED Notes (Signed)
While nuerosurgeon assessing patient with PA patient response to pain.
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
## 2017-06-24 NOTE — ED Notes (Signed)
Called lab who stated received urine and will process order.
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
## 2017-06-24 NOTE — ED Notes (Signed)
Patient suddenly moved all extremities attempted to turn on right side and then stopped after talking to patient to stop moving and explained where she is at.

## 2017-06-25 ENCOUNTER — Other Ambulatory Visit: Payer: Self-pay

## 2017-06-25 ENCOUNTER — Inpatient Hospital Stay (HOSPITAL_COMMUNITY): Payer: No Typology Code available for payment source

## 2017-06-25 ENCOUNTER — Encounter (HOSPITAL_COMMUNITY): Payer: Self-pay

## 2017-06-25 LAB — CBC
HCT: 34.4 % — ABNORMAL LOW (ref 36.0–46.0)
Hemoglobin: 11.4 g/dL — ABNORMAL LOW (ref 12.0–15.0)
MCH: 29.5 pg (ref 26.0–34.0)
MCHC: 33.1 g/dL (ref 30.0–36.0)
MCV: 89.1 fL (ref 78.0–100.0)
Platelets: 160 10*3/uL (ref 150–400)
RBC: 3.86 MIL/uL — ABNORMAL LOW (ref 3.87–5.11)
RDW: 12.5 % (ref 11.5–15.5)
WBC: 10 10*3/uL (ref 4.0–10.5)

## 2017-06-25 LAB — BASIC METABOLIC PANEL
Anion gap: 9 (ref 5–15)
BUN: 6 mg/dL (ref 6–20)
CO2: 18 mmol/L — ABNORMAL LOW (ref 22–32)
Calcium: 8.2 mg/dL — ABNORMAL LOW (ref 8.9–10.3)
Chloride: 107 mmol/L (ref 101–111)
Creatinine, Ser: 0.62 mg/dL (ref 0.44–1.00)
GFR calc Af Amer: >60 mL/min (ref >60)
GFR calc non Af Amer: >60 mL/min (ref >60)
Glucose, Bld: 105 mg/dL — ABNORMAL HIGH (ref 65–99)
Potassium: 3.9 mmol/L (ref 3.5–5.1)
Sodium: 134 mmol/L — ABNORMAL LOW (ref 135–145)

## 2017-06-25 LAB — BLOOD PRODUCT ORDER (VERBAL) VERIFICATION

## 2017-06-25 MED ORDER — MIDAZOLAM HCL 2 MG/2ML IJ SOLN
4.0000 mg | Freq: Once | INTRAMUSCULAR | Status: AC
  Administered 2017-06-25: 2 mg via INTRAVENOUS
  Filled 2017-06-25: qty 4

## 2017-06-25 NOTE — Progress Notes (Signed)
Patient transported back to 4N26 from radiology department with RT, transport tech, and RN. Patient stable on arrival.
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
## 2017-06-25 NOTE — Progress Notes (Signed)
Patient transported to radiology with RT and transport tech on ventilator and monitor with propofol drip for MRI. Patient stable on arrival, but very restless. Patient given versed 2 mg IVP, but became very restless within 15 minutes. Propofol drip increased. Patient now calm enough for exam. Continuous monitoring by RT and RN in progress.

## 2017-06-25 NOTE — Progress Notes (Signed)
Subjective No acute events. Repeat head CT showed mild increase in blood in vertex. Seen by neurosurgery; planning MRI and possibly EEG.  Objective: Vital signs in last 24 hours: Temp:  [95.4 F (35.2 C)-100.2 F (37.9 C)] 98.6 F (37 C) (11/19 0800) Pulse Rate:  [55-90] 72 (11/19 0900) Resp:  [14-20] 14 (11/19 0900) BP: (100-129)/(64-90) 111/67 (11/19 0900) SpO2:  [99 %-100 %] 100 % (11/19 0900) FiO2 (%):  [30 %-100 %] 30 % (11/19 0820) Weight:  [69.4 kg (152 lb 16 oz)-70 kg (154 lb 5.2 oz)] 69.4 kg (152 lb 16 oz) (11/18 1547) Last BM Date: (pta)  Intake/Output from previous day: 11/18 0701 - 11/19 0700 In: 2860.9 [I.V.:2650.9; IV Piggyback:210] Out: 1295 [Urine:1285; Emesis/NG output:10] Intake/Output this shift: No intake/output data recorded.  Gen: NAD, intubated/sedated Neuro: Localizing to pain bilaterally Eyes: R pupil remains nonreactive; ~28mm. L reactive to light CV: RRR Pulm: Intubated; lungs ctab Abd: Soft, ND Ext: SCDs in place  Lab Results: CBC  Recent Labs    06/24/17 1207 06/24/17 1214 06/25/17 0307  WBC 8.9  --  10.0  HGB 12.5 12.9 11.4*  HCT 37.7 38.0 34.4*  PLT 193  --  160   BMET Recent Labs    06/24/17 1207 06/24/17 1214 06/25/17 0307  NA 137 138 134*  K 3.9 4.4 3.9  CL 107 105 107  CO2 21*  --  18*  GLUCOSE 107* 106* 105*  BUN 12 17 6   CREATININE 0.74 0.60 0.62  CALCIUM 8.8*  --  8.2*   PT/INR Recent Labs    06/24/17 1207  LABPROT 13.4  INR 1.03   ABG Recent Labs    06/24/17 1518  PHART 7.434  HCO3 21.7    Studies/Results:  Anti-infectives: Anti-infectives (From admission, onward)   None       Assessment/Plan: Patient Active Problem List   Diagnosis Date Noted  . 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 Possible EEG - as per neurosurgery - possible ICP CV: Stable, CPM Pulm: Stable, CPM on minimal vent settings GI: Stable; initiate TFs tomorrow if OG outputs low and remains intubated - had 2 emesis yesterday. Renal: Adequate uop; normal creatinine; continue foley today; Heme: No issues; CPM MSK: No issues; CPM Endo: No issues; CPM PPx: SCDs, PPI; holding chemical DVT ppx given head bleed Dispo: SICU   LOS: 1 day   Sharon [place] Dema Severin, M.D. [facility]

## 2017-06-25 NOTE — Progress Notes (Signed)
Pt transported on vent to MRI and then returned to 4N26. Pt's vitals remained stable throughout.

## 2017-06-26 LAB — BASIC METABOLIC PANEL
Anion gap: 9 (ref 5–15)
BUN: <5 mg/dL — ABNORMAL LOW (ref 6–20)
CO2: 19 mmol/L — ABNORMAL LOW (ref 22–32)
Calcium: 8.1 mg/dL — ABNORMAL LOW (ref 8.9–10.3)
Chloride: 105 mmol/L (ref 101–111)
Creatinine, Ser: 0.56 mg/dL (ref 0.44–1.00)
GFR calc Af Amer: >60 mL/min (ref >60)
GFR calc non Af Amer: >60 mL/min (ref >60)
Glucose, Bld: 82 mg/dL (ref 65–99)
Potassium: 3.8 mmol/L (ref 3.5–5.1)
Sodium: 133 mmol/L — ABNORMAL LOW (ref 135–145)

## 2017-06-26 LAB — CBC
HCT: 32.5 % — ABNORMAL LOW (ref 36.0–46.0)
Hemoglobin: 11.1 g/dL — ABNORMAL LOW (ref 12.0–15.0)
MCH: 29.9 pg (ref 26.0–34.0)
MCHC: 34.2 g/dL (ref 30.0–36.0)
MCV: 87.6 fL (ref 78.0–100.0)
Platelets: 166 10*3/uL (ref 150–400)
RBC: 3.71 MIL/uL — ABNORMAL LOW (ref 3.87–5.11)
RDW: 12.5 % (ref 11.5–15.5)
WBC: 8.4 10*3/uL (ref 4.0–10.5)

## 2017-06-26 LAB — MAGNESIUM
Magnesium: 1.7 mg/dL (ref 1.7–2.4)
Magnesium: 1.8 mg/dL (ref 1.7–2.4)

## 2017-06-26 LAB — GLUCOSE, CAPILLARY
Glucose-Capillary: 104 mg/dL — ABNORMAL HIGH (ref 65–99)
Glucose-Capillary: 118 mg/dL — ABNORMAL HIGH (ref 65–99)

## 2017-06-26 LAB — PHOSPHORUS
Phosphorus: 2.1 mg/dL — ABNORMAL LOW (ref 2.5–4.6)
Phosphorus: 1.8 mg/dL — ABNORMAL LOW (ref 2.5–4.6)

## 2017-06-26 MED ORDER — PRO-STAT SUGAR FREE PO LIQD: 30.0000 mL | Freq: Two times a day (BID) | ORAL | Status: DC

## 2017-06-26 MED ORDER — PRO-STAT SUGAR FREE PO LIQD
60.0000 mL | Freq: Two times a day (BID) | ORAL | Status: DC
  Administered 2017-06-26 – 2017-06-27 (×2): 60 mL
  Filled 2017-06-26 (×2): qty 60

## 2017-06-26 MED ORDER — ADULT MULTIVITAMIN LIQUID CH
15.0000 mL | Freq: Every day | ORAL | Status: DC
  Administered 2017-06-26 – 2017-06-27 (×2): 15 mL
  Filled 2017-06-26 (×2): qty 15

## 2017-06-26 MED ORDER — VITAL HIGH PROTEIN PO LIQD: 1000.0000 mL | ORAL | Status: DC

## 2017-06-26 MED ORDER — PIVOT 1.5 CAL PO LIQD
1000.0000 mL | ORAL | Status: DC
  Administered 2017-06-26: 1000 mL
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
## 2017-06-27 ENCOUNTER — Ambulatory Visit: Payer: Medicaid Other | Admitting: "Endocrinology

## 2017-06-27 ENCOUNTER — Inpatient Hospital Stay (HOSPITAL_COMMUNITY): Payer: No Typology Code available for payment source

## 2017-06-27 LAB — GLUCOSE, CAPILLARY
Glucose-Capillary: 100 mg/dL — ABNORMAL HIGH (ref 65–99)
Glucose-Capillary: 114 mg/dL — ABNORMAL HIGH (ref 65–99)
Glucose-Capillary: 115 mg/dL — ABNORMAL HIGH (ref 65–99)
Glucose-Capillary: 134 mg/dL — ABNORMAL HIGH (ref 65–99)
Glucose-Capillary: 134 mg/dL — ABNORMAL HIGH (ref 65–99)

## 2017-06-27 LAB — CBC
HCT: 31.4 % — ABNORMAL LOW (ref 36.0–46.0)
Hemoglobin: 10.7 g/dL — ABNORMAL LOW (ref 12.0–15.0)
MCH: 30.3 pg (ref 26.0–34.0)
MCHC: 34.1 g/dL (ref 30.0–36.0)
MCV: 89 fL (ref 78.0–100.0)
Platelets: 157 10*3/uL (ref 150–400)
RBC: 3.53 MIL/uL — ABNORMAL LOW (ref 3.87–5.11)
RDW: 12.9 % (ref 11.5–15.5)
WBC: 12.1 10*3/uL — ABNORMAL HIGH (ref 4.0–10.5)

## 2017-06-27 LAB — BASIC METABOLIC PANEL
Anion gap: 8 (ref 5–15)
BUN: 5 mg/dL — ABNORMAL LOW (ref 6–20)
CO2: 23 mmol/L (ref 22–32)
Calcium: 8.1 mg/dL — ABNORMAL LOW (ref 8.9–10.3)
Chloride: 106 mmol/L (ref 101–111)
Creatinine, Ser: 0.49 mg/dL (ref 0.44–1.00)
GFR calc Af Amer: >60 mL/min (ref >60)
GFR calc non Af Amer: >60 mL/min (ref >60)
Glucose, Bld: 140 mg/dL — ABNORMAL HIGH (ref 65–99)
Potassium: 2.6 mmol/L — CL (ref 3.5–5.1)
Sodium: 137 mmol/L (ref 135–145)

## 2017-06-27 LAB — TRIGLYCERIDES: Triglycerides: 56 mg/dL (ref <150)

## 2017-06-27 LAB — MAGNESIUM
Magnesium: 1.8 mg/dL (ref 1.7–2.4)
Magnesium: 1.7 mg/dL (ref 1.7–2.4)

## 2017-06-27 LAB — PHOSPHORUS
Phosphorus: 1.4 mg/dL — ABNORMAL LOW (ref 2.5–4.6)
Phosphorus: 1.7 mg/dL — ABNORMAL LOW (ref 2.5–4.6)

## 2017-06-27 MED ORDER — MIDAZOLAM HCL 2 MG/2ML IJ SOLN: 2.0000 mg | INTRAMUSCULAR | Status: DC | PRN

## 2017-06-27 MED ORDER — KCL IN DEXTROSE-NACL 30-5-0.45 MEQ/L-%-% IV SOLN
INTRAVENOUS | Status: DC
  Administered 2017-06-27 – 2017-06-30 (×5): via INTRAVENOUS
  Filled 2017-06-27 (×7): qty 1000

## 2017-06-27 MED ORDER — FENTANYL CITRATE (PF) 100 MCG/2ML IJ SOLN: 50.0000 ug | INTRAMUSCULAR | Status: DC | PRN

## 2017-06-27 MED ORDER — POTASSIUM CHLORIDE 10 MEQ/100ML IV SOLN
10.0000 meq | INTRAVENOUS | Status: AC
  Administered 2017-06-27 (×3): 10 meq via INTRAVENOUS
  Filled 2017-06-27 (×4): qty 100

## 2017-06-27 MED ORDER — POTASSIUM CHLORIDE 10 MEQ/100ML IV SOLN
10.0000 meq | INTRAVENOUS | Status: AC
  Administered 2017-06-27 (×3): 10 meq via INTRAVENOUS
  Filled 2017-06-27 (×3): qty 100
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
## 2017-06-27 NOTE — Progress Notes (Signed)
Follow up - Trauma and Critical Care  Patient Details:    Ebony Jones is an 46 y.o. female.  Lines/tubes : Airway 7.5 mm (Active)  Secured at (cm) 2 cm 06/27/2017  8:47 AM  Measured From Lips 06/27/2017  8:47 AM  Secured Location Center 06/27/2017  8:47 AM  Secured By [employer] 06/27/2017  8:47 AM  Tube Holder Repositioned Yes 06/27/2017  8:47 AM  Cuff Pressure (cm H2O) 24 cm H2O 06/26/2017  8:35 PM  Site Condition Cool;Dry 06/27/2017  8:47 AM     NG/OG Tube Orogastric 16 Fr. Right mouth (Active)  Site Assessment Clean;Dry;Intact 06/27/2017  8:00 AM  Ongoing Placement Verification No change in cm markings or external length of tube from initial placement;No change in respiratory status;No acute changes, not attributed to clinical condition 06/27/2017  8:00 AM  Status Infusing tube feed 06/27/2017  8:00 AM  Drainage Appearance Green;Bile 06/26/2017  8:00 AM  Intake (mL) 100 mL 06/27/2017  9:00 AM  Output (mL) 250 mL 06/26/2017  6:00 AM     Urethral Catheter 43761 Non-latex;Temperature probe 14 Fr. (Active)  Indication for Insertion or Continuance of Catheter Unstable critical patients (first 24-48 hours) 06/27/2017  7:17 AM  Site Assessment Clean;Intact 06/26/2017  8:00 AM  Catheter Maintenance Bag below level of bladder;Catheter secured;Drainage bag/tubing not touching floor;Insertion date on drainage bag;No dependent loops;Seal intact 06/27/2017  7:45 AM  Collection Container Standard drainage bag 06/26/2017  8:00 AM  Securement Method Securing device (Describe) 06/26/2017  8:00 AM  Urinary Catheter Interventions Unclamped 06/26/2017  8:00 AM  Output (mL) 175 mL 06/27/2017 10:00 AM    Microbiology/Sepsis markers: Results for orders placed or performed during the hospital encounter of 06/24/17  MRSA PCR Screening     Status: None   Collection Time: 06/24/17  3:47 PM  Result Value Ref Range Status   MRSA by PCR NEGATIVE NEGATIVE Final    Comment:        The  GeneXpert MRSA Assay (FDA approved for NASAL specimens only), is one component of a comprehensive MRSA colonization surveillance program. It is not intended to diagnose MRSA infection nor to guide or monitor treatment for MRSA infections.     Anti-infectives:  Anti-infectives (From admission, onward)   None      Best Practice/Protocols:  VTE Prophylaxis: Mechanical GI Prophylaxis: Proton Pump Inhibitor Just stopped sedation for patient to get extubated.  Consults: Treatment Team:  Consuella Lose, MD    Events:  Subjective:    Overnight Issues: Very agitated as we try to get the patient extubated.  Will stope tube feedings.  Objective:  Vital signs for last 24 hours: Temp:  [98.2 F (36.8 C)-98.9 F (37.2 C)] 98.4 F (36.9 C) (11/21 0800) Pulse Rate:  [55-101] 65 (11/21 1000) Resp:  [11-23] 18 (11/21 1000) BP: (86-148)/(55-109) 114/78 (11/21 1000) SpO2:  [98 %-100 %] 100 % (11/21 1000) FiO2 (%):  [30 %] 30 % (11/21 1000) Weight:  [68.7 kg (151 lb 7.3 oz)-69.6 kg (153 lb 7 oz)] 68.7 kg (151 lb 7.3 oz) (11/21 0431)  Hemodynamic parameters for last 24 hours:    Intake/Output from previous day: 11/20 0701 - 11/21 0700 In: 3516.7 [I.V.:2676.7; NG/GT:530; IV Piggyback:310] Out: 2330 [Urine:2330]  Intake/Output this shift: Total I/O In: 684 [I.V.:324; NG/GT:160; IV Piggyback:200] Out: 325 [Urine:325]  Vent settings for last 24 hours: Vent Mode: PRVC FiO2 (%):  [30 %] 30 % Set Rate:  [18 bmp] 18 bmp Vt Set:  [460 mL]  460 mL PEEP:  [5 cmH20] 5 cmH20 Pressure Support:  [5 cmH20] 5 cmH20 Plateau Pressure:  [13 cmH20-16 cmH20] 15 cmH20  Physical Exam:  General: no respiratory distress and agitated Neuro: confused, agitated and Moves all fours with great strength Resp: clear to auscultation bilaterally and CXR is clear CVS: regular rate and rhythm, S1, S2 normal, no murmur, click, rub or gallop and intermitten sinus tachycardia GI: soft, nontender, BS  WNL, no r/g Extremities: no edema, no erythema, pulses WNL  Results for orders placed or performed during the hospital encounter of 06/24/17 (from the past 24 hour(s))  Magnesium     Status: None   Collection Time: 06/26/17 10:48 AM  Result Value Ref Range   Magnesium 1.7 1.7 - 2.4 mg/dL  Phosphorus     Status: Abnormal   Collection Time: 06/26/17 10:48 AM  Result Value Ref Range   Phosphorus 2.1 (L) 2.5 - 4.6 mg/dL  Magnesium     Status: None   Collection Time: 06/26/17  4:03 PM  Result Value Ref Range   Magnesium 1.8 1.7 - 2.4 mg/dL  Phosphorus     Status: Abnormal   Collection Time: 06/26/17  4:03 PM  Result Value Ref Range   Phosphorus 1.8 (L) 2.5 - 4.6 mg/dL  Glucose, capillary     Status: Abnormal   Collection Time: 06/26/17  7:46 PM  Result Value Ref Range   Glucose-Capillary 104 (H) 65 - 99 mg/dL  Glucose, capillary     Status: Abnormal   Collection Time: 06/26/17 11:36 PM  Result Value Ref Range   Glucose-Capillary 118 (H) 65 - 99 mg/dL  CBC     Status: Abnormal   Collection Time: 06/27/17  5:24 AM  Result Value Ref Range   WBC 12.1 (H) 4.0 - 10.5 K/uL   RBC 3.53 (L) 3.87 - 5.11 MIL/uL   Hemoglobin 10.7 (L) 12.0 - 15.0 g/dL   HCT 31.4 (L) 36.0 - 46.0 %   MCV 89.0 78.0 - 100.0 fL   MCH 30.3 26.0 - 34.0 pg   MCHC 34.1 30.0 - 36.0 g/dL   RDW 12.9 11.5 - 15.5 %   Platelets 157 150 - 400 K/uL  Basic metabolic panel     Status: Abnormal   Collection Time: 06/27/17  5:24 AM  Result Value Ref Range   Sodium 137 135 - 145 mmol/L   Potassium 2.6 (LL) 3.5 - 5.1 mmol/L   Chloride 106 101 - 111 mmol/L   CO2 23 22 - 32 mmol/L   Glucose, Bld 140 (H) 65 - 99 mg/dL   BUN 5 (L) 6 - 20 mg/dL   Creatinine, Ser 0.49 0.44 - 1.00 mg/dL   Calcium 8.1 (L) 8.9 - 10.3 mg/dL   GFR calc non Af Amer >60 >60 mL/min   GFR calc Af Amer >60 >60 mL/min   Anion gap 8 5 - 15  Magnesium     Status: None   Collection Time: 06/27/17  5:24 AM  Result Value Ref Range   Magnesium 1.8 1.7 - 2.4  mg/dL  Phosphorus     Status: Abnormal   Collection Time: 06/27/17  5:24 AM  Result Value Ref Range   Phosphorus 1.4 (L) 2.5 - 4.6 mg/dL  Triglycerides     Status: None   Collection Time: 06/27/17  5:24 AM  Result Value Ref Range   Triglycerides 56 <150 mg/dL  Glucose, capillary     Status: Abnormal   Collection Time: 06/27/17  8:08  AM  Result Value Ref Range   Glucose-Capillary 134 (H) 65 - 99 mg/dL     Assessment/Plan:   NEURO  Altered Mental Status:  agitation and delirium   Plan: Hopefully will improve with extubation.  PULM  No specific issues   Plan: Extubate  CARDIO  Sinus Tachycardia   Plan: No specific treatment.  RENAL  Urine output and renal function is good.   Plan: No specific treatment  GI  No specific issues   Plan: CPM  ID  No known infectious sources   Plan: CPM  HEME  Anemia acute blood loss anemia)   Plan: No transfusion necessary  ENDO No specific issues   Plan: CPM  Global Issues  Patient extubated now.  Doing fine.  Still very TBI acting.  Will get the TBI team involved.    LOS: 3 days   Additional comments:I reviewed the patient's new clinical lab test results. cbc/bmet and I reviewed the patients new imaging test results. cxr  Critical Care Total Time*: 30 Minutes  [name] 06/27/2017  *Care during the described time interval was provided by me and/or other providers on the critical care team.  I have reviewed this patient's available data, including medical history, events of note, physical examination and test results as part of my evaluation.

## 2017-06-27 NOTE — Procedures (Signed)
Extubation Procedure Note  Patient Details:   Name: Ebony Jones DOB: 07-02-1971 MRN: [id number]   Airway Documentation:     Evaluation  O2 sats: stable throughout Complications: No apparent complications Patient did tolerate procedure well. Bilateral Breath Sounds: Rhonchi   Yes   Patient extubated to 2 LNC at this time per MD order, and doing well. Able to cough and clear secretions, trying to vocalize. RT to monitor as needed  Saunders Glance 06/27/2017, 10:52 AM

## 2017-06-28 LAB — CBC WITH DIFFERENTIAL/PLATELET
Basophils Absolute: 0 10*3/uL (ref 0.0–0.1)
Basophils Relative: 0 %
Eosinophils Absolute: 0 10*3/uL (ref 0.0–0.7)
Eosinophils Relative: 1 %
HCT: 30 % — ABNORMAL LOW (ref 36.0–46.0)
Hemoglobin: 10.1 g/dL — ABNORMAL LOW (ref 12.0–15.0)
Lymphocytes Relative: 13 %
Lymphs Abs: 1.1 10*3/uL (ref 0.7–4.0)
MCH: 30 pg (ref 26.0–34.0)
MCHC: 33.7 g/dL (ref 30.0–36.0)
MCV: 89 fL (ref 78.0–100.0)
Monocytes Absolute: 0.8 10*3/uL (ref 0.1–1.0)
Monocytes Relative: 10 %
Neutro Abs: 6.3 10*3/uL (ref 1.7–7.7)
Neutrophils Relative %: 76 %
Platelets: 151 10*3/uL (ref 150–400)
RBC: 3.37 MIL/uL — ABNORMAL LOW (ref 3.87–5.11)
RDW: 12.5 % (ref 11.5–15.5)
WBC: 8.3 10*3/uL (ref 4.0–10.5)

## 2017-06-28 LAB — GLUCOSE, CAPILLARY
Glucose-Capillary: 139 mg/dL — ABNORMAL HIGH (ref 65–99)
Glucose-Capillary: 99 mg/dL (ref 65–99)
Glucose-Capillary: 108 mg/dL — ABNORMAL HIGH (ref 65–99)
Glucose-Capillary: 86 mg/dL (ref 65–99)

## 2017-06-28 LAB — BASIC METABOLIC PANEL
Anion gap: 9 (ref 5–15)
BUN: 5 mg/dL — ABNORMAL LOW (ref 6–20)
CO2: 22 mmol/L (ref 22–32)
Calcium: 8.2 mg/dL — ABNORMAL LOW (ref 8.9–10.3)
Chloride: 106 mmol/L (ref 101–111)
Creatinine, Ser: 0.43 mg/dL — ABNORMAL LOW (ref 0.44–1.00)
GFR calc Af Amer: >60 mL/min (ref >60)
GFR calc non Af Amer: >60 mL/min (ref >60)
Glucose, Bld: 98 mg/dL (ref 65–99)
Potassium: 3.7 mmol/L (ref 3.5–5.1)
Sodium: 137 mmol/L (ref 135–145)

## 2017-06-28 MED ORDER — MIDAZOLAM HCL 2 MG/2ML IJ SOLN
2.0000 mg | INTRAMUSCULAR | Status: DC | PRN
  Administered 2017-07-02 (×2): 2 mg via INTRAVENOUS
  Filled 2017-06-28 (×2): qty 2

## 2017-06-28 MED ORDER — ORAL CARE MOUTH RINSE
15.0000 mL | Freq: Two times a day (BID) | OROMUCOSAL | Status: DC
  Administered 2017-06-28 – 2017-07-03 (×9): 15 mL via OROMUCOSAL

## 2017-06-28 NOTE — Progress Notes (Signed)
Rehab Admissions Coordinator Note:  Patient was screened by Cleatrice Burke for appropriateness for an Inpatient Acute Rehab Consult per PT recommendation.  At this time, we are recommending Inpatient Rehab consult. Please place order for a consult.  Cleatrice Burke 06/28/2017,  [phone]
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
## 2017-06-28 NOTE — Progress Notes (Signed)
Patient ID: Ebony Jones, female   DOB: 1971-01-28, 46 y.o.   MRN: [id number] Patient appears neurologically unchanged. Pupils equal reactive moves all extremities well does not follow commands  CT scan MRI scan consistent with D AI continue observation the ICU for now.

## 2017-06-29 ENCOUNTER — Inpatient Hospital Stay (HOSPITAL_COMMUNITY): Payer: No Typology Code available for payment source

## 2017-06-29 LAB — BASIC METABOLIC PANEL
Anion gap: 7 (ref 5–15)
BUN: 6 mg/dL (ref 6–20)
CO2: 26 mmol/L (ref 22–32)
Calcium: 8.5 mg/dL — ABNORMAL LOW (ref 8.9–10.3)
Chloride: 109 mmol/L (ref 101–111)
Creatinine, Ser: 0.56 mg/dL (ref 0.44–1.00)
GFR calc Af Amer: >60 mL/min (ref >60)
GFR calc non Af Amer: >60 mL/min (ref >60)
Glucose, Bld: 85 mg/dL (ref 65–99)
Potassium: 4 mmol/L (ref 3.5–5.1)
Sodium: 142 mmol/L (ref 135–145)

## 2017-06-29 MED ORDER — ACETAMINOPHEN 325 MG PO TABS
650.0000 mg | ORAL_TABLET | Freq: Four times a day (QID) | ORAL | Status: DC | PRN
  Administered 2017-06-29 – 2017-07-02 (×4): 650 mg via ORAL
  Filled 2017-06-29 (×4): qty 2

## 2017-06-29 MED ORDER — LEVOTHYROXINE SODIUM 112 MCG PO TABS
112.0000 ug | ORAL_TABLET | Freq: Every day | ORAL | Status: DC
  Administered 2017-06-30 – 2017-07-03 (×4): 112 ug via ORAL
  Filled 2017-06-29 (×4): qty 1

## 2017-06-29 MED ORDER — ESCITALOPRAM OXALATE 10 MG PO TABS
20.0000 mg | ORAL_TABLET | Freq: Every day | ORAL | Status: DC
  Administered 2017-06-29 – 2017-07-03 (×5): 20 mg via ORAL
  Filled 2017-06-29 (×5): qty 2

## 2017-06-29 NOTE — Progress Notes (Signed)
Patient ID: Ebony Jones, female   DOB: 1971-07-31, 46 y.o.   MRN: [id number] Patient more awake this morning. She would not follow commands but she did say "I'm not feeling well this morning" elaborate on this. Pupils equal moves all extremity is well  Okay to transfer to the floor per trauma
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
## 2017-06-29 NOTE — Progress Notes (Signed)
Modified Barium Swallow Progress Note  Patient Details  Name: Ebony Jones MRN: [id number] Date of Birth: 09/19/1970  Today's Date: 06/29/2017  Modified Barium Swallow completed.  Full report located under Chart Review in the Imaging Section.  Brief recommendations include the following:  Clinical Impression  Patient present with a mild, primarily oral dysphagia, characterized by right sided facial deficits, which when combined with cognitive impairments, resulting in anterior labial spillage of liquids, delayed oral transit and peicemeal manipulation of bolus. Airway protection intact with the exception of one episode of silent aspiration with multiple large consecutive boluses of thin liquids in which oral residue spilled below the vocal cords post swallow. Subsequent trials of thin liquid, even when challenged with additonal multiple consecutive sips and dual texture, resulted in only intermitten flash penetration. Recommend mechanical soft diet with thin liquid. Risk for aspiration of liquids remains moderate high given TBI with resultant impulsivity and poor sustained attention. Will f/u closely.    Swallow Evaluation Recommendations       SLP Diet Recommendations: Dysphagia 3 (Mech soft) solids;Thin liquid   Liquid Administration via: Cup;No straw   Medication Administration: Whole meds with puree   Supervision: Patient able to self feed;Full supervision/cueing for compensatory strategies   Compensations: Minimize environmental distractions;Slow rate;Small sips/bites;Monitor for anterior loss   Postural Changes: Seated upright at 90 degrees   Oral Care Recommendations: Oral care BID     Gabriel Rainwater MA, CCC-SLP [phone]     Cesilia Shinn [name] 06/29/2017,10:46 AM
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
## 2017-06-30 ENCOUNTER — Other Ambulatory Visit: Payer: Self-pay

## 2017-06-30 MED ORDER — LEVETIRACETAM 500 MG PO TABS
500.0000 mg | ORAL_TABLET | Freq: Two times a day (BID) | ORAL | Status: AC
  Administered 2017-06-30 – 2017-07-01 (×2): 500 mg via ORAL
  Filled 2017-06-30 (×2): qty 1
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
## 2017-07-01 NOTE — Progress Notes (Signed)
1900: Handoff report received from RN. Pt resting comfortably. Oriented only to self. No complaints. Denies pain, CPOT/PAINAD both 2. Continues with po intake; IV fluids held.  0000: Pt tries to get up only to use the bedside commode, but does not call out. Frequent reminders to call for assistance ambulating. Easily managed with observation and bed alarms.  0100: After increasing agitation (RASS of 2), pt given PRN versed and currently sleeping.  0300: Pt continues resting comfortably and is easy to arouse for assessment.  0400: Pt with increasing agitation. Multiple attempts to get oob but unable to verbalize her intentions. Pt agrees that she should call before attempting to ambulate, but she does not remember this before trying to get up. Given PRN versed and is currently sleeping.  0600: Pt again with increasing agitation. C/o feeling poorly, but denies pain. C/o fatigue and "just not feeling good."   0700: Handoff report given to RN.

## 2017-07-01 NOTE — Progress Notes (Addendum)
1900: Handoff report received from RN. Pt resting comfortably. Oriented only to self. No complaints. Denies pain, CPOT/PAINAD negative. Upon initial assessment, pt did not have continuous infusion as ordered. Requested from pharmacy.  2100: Continuous infusion restarted.  0000: Pt tries to get up about once an hour, completely unaware of her own limitations or safety issues. Even with redirection, she can be persistent in getting up without waiting for assistance. PIV found to be occluded. New access gained in the right wrist.  0400: Pt continues frequent attempts at getting out of bed. She usually is not sure where she is going or what she is attempting to do. She is willing to climb over bed rails. Frequent verbal contacts and the bed alarm system have thus far been adequate management for her fall risk.  0600: PIV removed by pt. Restarted.  0700: PIV again removed by pt. Handoff report given to RN. Surg MD Kae Heller aware. IV infusion held so long as po intake adequate.
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
## 2017-07-02 ENCOUNTER — Encounter: Payer: Self-pay | Admitting: Physical Medicine and Rehabilitation

## 2017-07-02 ENCOUNTER — Encounter (HOSPITAL_COMMUNITY): Payer: Self-pay | Admitting: Physical Medicine and Rehabilitation

## 2017-07-02 DIAGNOSIS — S062X9D Diffuse traumatic brain injury with loss of consciousness of unspecified duration, subsequent encounter: Secondary | ICD-10-CM

## 2017-07-02 DIAGNOSIS — F1210 Cannabis abuse, uncomplicated: Secondary | ICD-10-CM

## 2017-07-02 DIAGNOSIS — D62 Acute posthemorrhagic anemia: Secondary | ICD-10-CM

## 2017-07-02 DIAGNOSIS — I609 Nontraumatic subarachnoid hemorrhage, unspecified: Secondary | ICD-10-CM

## 2017-07-02 DIAGNOSIS — S069X9D Unspecified intracranial injury with loss of consciousness of unspecified duration, subsequent encounter: Secondary | ICD-10-CM

## 2017-07-02 DIAGNOSIS — R1310 Dysphagia, unspecified: Secondary | ICD-10-CM

## 2017-07-02 DIAGNOSIS — S062X9A Diffuse traumatic brain injury with loss of consciousness of unspecified duration, initial encounter: Secondary | ICD-10-CM

## 2017-07-02 DIAGNOSIS — F1020 Alcohol dependence, uncomplicated: Secondary | ICD-10-CM | POA: Insufficient documentation

## 2017-07-02 MED ORDER — CLONAZEPAM 0.5 MG PO TABS
0.5000 mg | ORAL_TABLET | Freq: Three times a day (TID) | ORAL | Status: DC | PRN
  Administered 2017-07-02 – 2017-07-03 (×2): 0.5 mg via ORAL
  Filled 2017-07-02 (×2): qty 1

## 2017-07-02 MED ORDER — ENOXAPARIN SODIUM 40 MG/0.4ML ~~LOC~~ SOLN
40.0000 mg | SUBCUTANEOUS | Status: DC
Start: 2017-07-02 — End: 2017-07-03
  Administered 2017-07-02: 40 mg via SUBCUTANEOUS
  Filled 2017-07-02: qty 0.4

## 2017-07-02 MED ORDER — BOOST / RESOURCE BREEZE PO LIQD
1.0000 | Freq: Three times a day (TID) | ORAL | Status: DC
  Administered 2017-07-02 (×2): 1 via ORAL

## 2017-07-02 MED ORDER — PROPRANOLOL HCL 10 MG PO TABS
10.0000 mg | ORAL_TABLET | Freq: Two times a day (BID) | ORAL | Status: DC
  Administered 2017-07-02 – 2017-07-03 (×3): 10 mg via ORAL
  Filled 2017-07-02 (×3): qty 1

## 2017-07-02 MED ORDER — ENSURE ENLIVE PO LIQD
237.0000 mL | Freq: Two times a day (BID) | ORAL | Status: DC
  Administered 2017-07-02 (×2): 237 mL via ORAL

## 2017-07-02 NOTE — Progress Notes (Signed)
Rehab admissions - I am following for potential acute inpatient rehab admission.  Patient was too sleepy and lethargic this am for me to discuss rehab with her.  I will follow up in am.  Call me for questions.  #[phone]
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
## 2017-07-02 NOTE — Discharge Instructions (Signed)
Living With Traumatic Brain Injury Traumatic brain injury (TBI) is an injury to the brain that may be mild, moderate, or severe. Symptoms of any type of TBI can be long lasting (chronic). Depending on the area of the brain that is affected, a TBI can interfere with vision, memory, concentration, speech, balance, sense of touch, and sleep. TBI can also cause chronic symptoms like headache or dizziness. How to cope with lifestyle changes After a TBI, you may need to make changes to your lifestyle in order to recover as well as possible. How quickly and how fully you recover will depend on the severity of your injury. Your recovery plan may involve:  Working with specialists to develop a rehabilitation plan to help you return to your regular activities. Your health care team may include: ? Physical or occupational therapists. ? Speech and language pathologists. ? Mental health counselors. ? Physicians like your primary care physician or neurologist.  Taking time off work or school, depending on your injury.  Avoiding situations where there is a risk for another head injury, such as football, hockey, soccer, basketball, martial arts, downhill snow sports, and horseback riding. Do not do these activities until your health care provider approves.  Resting. Rest helps the brain to heal. Make sure you: ? Get plenty of sleep at night. Avoid staying up late at night. ? Keep the same bedtime hours on weekends and weekdays. ? Rest during the day. Take daytime naps or rest breaks when you feel tired.  Avoiding extra stress on your eyes. You may need to set time limits when working on the computer, watching TV, and reading.  Finding ways to manage stress. This may include: ? Avoiding activities that cause stress. ? Deep breathing, yoga, or meditation. ? Listening to music or spending time outdoors.  Making lists, setting reminders, or using a day planner to help your memory.  Allowing yourself plenty  of time to complete everyday tasks, such as grocery shopping, paying bills, and doing laundry.  Avoiding driving. Your ability to drive safely may be affected by your injury. ? Rely on family, friends, or a transportation service to help you get around and to appointments. ? Have a professional evaluation to check your driving ability. ? Access support services to help you return to driving. These may include training and adaptive equipment.  Follow these instructions at home:  Take over-the-counter and prescription medicines only as told by your health care provider. Do not take aspirin or other anti-inflammatory medicines such as ibuprofen or naproxen unless approved by your health care provider.  Avoid large amounts of caffeine. Your body may be more sensitive to it after your injury.  Do not use any products that contain nicotine or tobacco, such as cigarettes, e-cigarettes, nicotine gum, and patches. If you need help quitting, ask your health care provider.  Do not use drugs.  Limit alcohol intake to no more than 1 drink per day for nonpregnant women and 2 drinks per day for men. One drink equals 12 ounces of beer, 5 ounces of wine, or 1 ounces of hard liquor.  Do not drive until cleared by your health care provider.  Keep all follow-up visits as told by your health care provider. This is important. Where to find support:  Talk with your employer, co-workers, teachers, or school counselor about your injury. Work together to develop a plan for completing tasks while you recover.  Talk to others living with a TBI. Join a support group with other  people who have experienced a TBI.  Let your friends and family members know what they can do to help. This might include helping at home or transportation to appointments.  If you are unable to continue working after your injury, talk to a Education officer, museum about options to help you meet your financial needs.  Seek out additional resources if  you are a Teacher, adult education or family member, such as: ? Defense and [place]: dvbic.dcoe.mil ? Department of [place]: 670-407-4163 Questions to ask your health care provider:  How serious is my injury?  What is my rehabilitation plan?  What is my expected recovery?  When can I return to work or school?  When can I return to regular activities, including driving? Contact a health care provider if:  You have new or worsening: ? Dizziness. ? Headache. ? Anxiety or depression. ? Irritability. ? Confusion. ? Jerky movements that you cannot control (seizures). ? Extreme sensitivity to light or sound. ? Nausea or vomiting. Summary  Traumatic brain injury (TBI) is an injury to your brain that can interfere with vision, memory, concentration, speech, balance, sense of touch, and sleep. TBI can also cause chronic symptoms like headache or dizziness.  After a TBI you may need to make several changes to your lifestyle in order to recover as well as possible. How quickly and how fully you recover will depend on the severity of your injury.  Talk to your family, friends, employer, co-workers, Tour manager, or school counselor about your injury. Work together to develop a plan for completing tasks while you recover. This information is not intended to replace advice given to you by your health care provider. Make sure you discuss any questions you have with your health care provider. Document Released: 07/20/2016 Document Revised: 07/20/2016 Document Reviewed: 07/20/2016 Elsevier Interactive Patient Education  2018 [place] With Traumatic Brain Injury Traumatic brain injury (TBI) is an injury to the brain that results from a hard, direct hit to the head. It also results from whiplash or a direct blow to the head by an object. TBI can be mild, moderate, or severe. What do I need to know about this  condition? Symptoms of any type of TBI can be long lasting (chronic). Depending on the area of the brain that is affected, a TBI can interfere with vision, memory, concentration, speech, balance, sense of touch, and sleep. TBI can also cause chronic symptoms like headache or dizziness. A mild TBI is also known as a concussion. A concussion usually involves being knocked-out (losing consciousness) for a short time or not at all, and results in less brain damage. A moderate or severe TBI involves losing consciousness for an extended period of time. This can result in more serious damage to the brain, such as memory loss after the injury. What do I need to know about my loved one's recovery? Traumatic brain injuries affect different people in different ways. It is important to understand that recovery is different for each person, and may take time. Mild injury Many people with a mild brain injury recover quickly and return to their normal activities and abilities. However, some people may continue to experience problems associated with their brain injury. This can cause frustration, anger, and moodiness for the person and their loved ones, especially if they expected a normal and complete recovery. Moderate or severe injury After a moderate or severe injury, there is usually an initial recovery period  that may last up to several weeks. During this time, your loved one may stay in the hospital or a rehabilitation center. Depending on your loved one's injury, age, recovery, and overall health, he or she may:  Be allowed to return home or enter a long-term care facility.  Need to continue rehabilitation with occupational, physical, and speech therapists to help restore their mobility and their ability to process information, speak, and do daily activities.  Have changes to mood and personality.  Experience changes to sleep, energy levels, appetite, balance, bladder control, and vision.  Have long-term  health complications, such as seizures, headaches, or chronic pain.  Need to take an extended time off work or school. Some people are not able to return to these activities after their injury.  How can I support my friend or family member? General help  Offer assistance with household chores or other daily tasks. Cook or make "freezer meals" that can be reheated.  Help your loved one establish a daily routine. This may include setting reminders or having a shared day planner or calendar.  Encourage rest. Your loved one may need frequent breaks during social situations or other activities.  Be patient. Your loved one may take longer to complete tasks and process information.  When giving instructions, give only one instruction at a time or make lists. Multitasking can be difficult for a person with a TBI.  Do not set expectations about your loved one's recovery. Medical appointments  Gently remind your loved one of tasks or appointments if he or she is forgetful.  Provide transportation to and from appointments.  Attend rehabilitation appointments with your loved one. Help with exercises at home. Preventing falls Take steps to prevent falls in your loved one's home. This may include:  Installing grab bars in the bathroom or handrails on stairs.  Using night lights in the bedroom, bathroom, or hallways.  Securing or removing rugs and cords in walkways.  Managing finances  Help manage finances. Talk with a Education officer, museum or legal professional if: ? Your loved one is unable to return to work and is in need of financial help. ? You need help paying medical bills. ? You need to establish guardianship over finances. ? You need help with estate planning. How can I care for myself? Lifestyle  Do not use drugs.  Avoid or limit alcohol use. Limit alcohol intake to no more than 1 drink per day for nonpregnant women and 2 drinks per day for men. One drink equals 12 ounces of beer, 5  ounces of wine, or 1 ounces of hard liquor.  Rest. Try to get 7-9 hours of uninterrupted sleep each night.  Eat a balanced diet that includes fresh fruits and vegetables, whole grains, lean proteins, and low-fat dairy.  Exercise for at least 30 minutes on 5 or more days each week.  Find ways to manage stress. This may include: ? Deep breathing, yoga, or meditation. ? Spending time outdoors. ? Journaling. Finding support  Ask for help. Take a break if you are the primary caregiver to your loved one.  Spend time with supportive people.  Join a support group with other caregivers or family members of people with TBI.  If you experience new or worsening depression or anxiety, seek counseling from a mental health professional. Where to find more information:  Learn more about TBI and find support through: ? Brainline: www.brainline.org ? Brain Injury Association of America: www.biausa.[place] and their families: ?  Defense and [place]: dvbic.dcoe.mil ? Department of [place]: 217-586-4284 Summary  A Traumatic brain injury (TBI) is an injury to the brain that can interfere with vision, memory, concentration, speech, balance, sense of touch, and sleep. TBI can also cause chronic symptoms like headache or dizziness.  Traumatic brain injuries affect different people in different ways. It is important to understand that recovery takes time and looks different for each person.  You can support your loved one with a TBI by assisting with daily tasks, setting reminders, encouraging rest, and being patient.  It is important to take care of yourself as a caregiver. Take time to exercise, manage stress, spend time with supportive people, and rest. Ask for help, find a support group, and meet with a mental health counselor, if needed. This information is not intended to replace advice given to you by your  health care provider. Make sure you discuss any questions you have with your health care provider. Document Released: 07/21/2016 Document Revised: 07/21/2016 Document Reviewed: 07/21/2016 Elsevier Interactive Patient Education  [employer].
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
## 2017-07-02 NOTE — Progress Notes (Signed)
SLP Cancellation Note  Patient Details Name: Travia Onstad MRN: [id number] DOB: August 31, 1970   Cancelled treatment:       Reason Eval/Treat Not Completed: Fatigue/lethargy limiting ability to participate. RN requested that patient be allowed to rest this am. Will f/u  Gabriel Rainwater [place], [place] [phone]    Gabriel Rainwater [name] 07/02/2017, 11:39 AM

## 2017-07-02 NOTE — Progress Notes (Signed)
1900: Handoff report received from RN. PT resting comfortably. Oriented only to self. No complaints. Denies pain. Sitter provided to manage impulsive bed exits. Report given to sitter, [place], NT. Continues with po intake; IV fluids held.  2100: Sitter reports that pt removed IV. This is the fourth IV removed by the pt in 2 days. Trauma MD consulted to assess need for new IV access vs po management. Sitter reports pt taking only sips of Boost drink. C/o distaste as with Ensure. Will continue to encourage intake.  2300: Trauma MD on-call ok with not restarting IV. Pt with increasing agitation, verbally aggressive towards sitter. PRN klonopin administered; pt chewed the tablet before swallowing it.  0000: Pt resting comfortably with sitter, Ivin Booty, [place]. Sitter reports occasional conversation and redirection required.  0400: Pt continues to rest comfortably. Easily arouses to voice for assessment. Impulsivity is much easier to manage after PRN klonopin.  0700: Handoff report given to RN.

## 2017-07-03 ENCOUNTER — Inpatient Hospital Stay (HOSPITAL_COMMUNITY)
Admission: RE | Admit: 2017-07-03 | Discharge: 2017-07-25 | DRG: 945 | Disposition: A | Discharge status: Home / Self Care | Payer: Medicaid Other | Source: Intra-hospital | Attending: Physical Medicine & Rehabilitation | Admitting: Physical Medicine & Rehabilitation

## 2017-07-03 ENCOUNTER — Other Ambulatory Visit: Payer: Self-pay

## 2017-07-03 ENCOUNTER — Encounter: Payer: Self-pay | Admitting: "Endocrinology

## 2017-07-03 ENCOUNTER — Encounter (HOSPITAL_COMMUNITY): Payer: Self-pay

## 2017-07-03 DIAGNOSIS — F419 Anxiety disorder, unspecified: Secondary | ICD-10-CM | POA: Diagnosis present

## 2017-07-03 DIAGNOSIS — S066X9D Traumatic subarachnoid hemorrhage with loss of consciousness of unspecified duration, subsequent encounter: Secondary | ICD-10-CM | POA: Diagnosis present

## 2017-07-03 DIAGNOSIS — R4189 Other symptoms and signs involving cognitive functions and awareness: Secondary | ICD-10-CM | POA: Diagnosis present

## 2017-07-03 DIAGNOSIS — F0281 Dementia in other diseases classified elsewhere with behavioral disturbance: Secondary | ICD-10-CM

## 2017-07-03 DIAGNOSIS — Z781 Physical restraint status: Secondary | ICD-10-CM | POA: Diagnosis not present

## 2017-07-03 DIAGNOSIS — R51 Headache: Secondary | ICD-10-CM

## 2017-07-03 DIAGNOSIS — S069X9S Unspecified intracranial injury with loss of consciousness of unspecified duration, sequela: Secondary | ICD-10-CM

## 2017-07-03 DIAGNOSIS — S0231XD Fracture of orbital floor, right side, subsequent encounter for fracture with routine healing: Secondary | ICD-10-CM

## 2017-07-03 DIAGNOSIS — Z880 Allergy status to penicillin: Secondary | ICD-10-CM

## 2017-07-03 DIAGNOSIS — H5704 Mydriasis: Secondary | ICD-10-CM | POA: Diagnosis present

## 2017-07-03 DIAGNOSIS — E039 Hypothyroidism, unspecified: Secondary | ICD-10-CM | POA: Diagnosis present

## 2017-07-03 DIAGNOSIS — Y92410 Unspecified street and highway as the place of occurrence of the external cause: Secondary | ICD-10-CM | POA: Diagnosis not present

## 2017-07-03 DIAGNOSIS — D259 Leiomyoma of uterus, unspecified: Secondary | ICD-10-CM | POA: Diagnosis present

## 2017-07-03 DIAGNOSIS — H4902 Third [oculomotor] nerve palsy, left eye: Secondary | ICD-10-CM | POA: Diagnosis present

## 2017-07-03 DIAGNOSIS — S062X3D Diffuse traumatic brain injury with loss of consciousness of 1 hour to 5 hours 59 minutes, subsequent encounter: Secondary | ICD-10-CM | POA: Diagnosis not present

## 2017-07-03 DIAGNOSIS — S062X3S Diffuse traumatic brain injury with loss of consciousness of 1 hour to 5 hours 59 minutes, sequela: Secondary | ICD-10-CM | POA: Diagnosis not present

## 2017-07-03 DIAGNOSIS — G479 Sleep disorder, unspecified: Secondary | ICD-10-CM

## 2017-07-03 DIAGNOSIS — F17290 Nicotine dependence, other tobacco product, uncomplicated: Secondary | ICD-10-CM | POA: Diagnosis present

## 2017-07-03 DIAGNOSIS — Z79890 Hormone replacement therapy (postmenopausal): Secondary | ICD-10-CM | POA: Diagnosis not present

## 2017-07-03 DIAGNOSIS — R519 Headache, unspecified: Secondary | ICD-10-CM

## 2017-07-03 DIAGNOSIS — D62 Acute posthemorrhagic anemia: Secondary | ICD-10-CM | POA: Diagnosis present

## 2017-07-03 DIAGNOSIS — S062X9D Diffuse traumatic brain injury with loss of consciousness of unspecified duration, subsequent encounter: Secondary | ICD-10-CM

## 2017-07-03 DIAGNOSIS — F0280 Dementia in other diseases classified elsewhere without behavioral disturbance: Secondary | ICD-10-CM

## 2017-07-03 DIAGNOSIS — F02818 Dementia in other diseases classified elsewhere, unspecified severity, with other behavioral disturbance: Secondary | ICD-10-CM

## 2017-07-03 LAB — CBC
HCT: 37.5 % (ref 36.0–46.0)
Hemoglobin: 12.7 g/dL (ref 12.0–15.0)
MCH: 30.1 pg (ref 26.0–34.0)
MCHC: 33.9 g/dL (ref 30.0–36.0)
MCV: 88.9 fL (ref 78.0–100.0)
Platelets: 363 10*3/uL (ref 150–400)
RBC: 4.22 MIL/uL (ref 3.87–5.11)
RDW: 12.2 % (ref 11.5–15.5)
WBC: 6.8 10*3/uL (ref 4.0–10.5)

## 2017-07-03 LAB — BASIC METABOLIC PANEL
Anion gap: 8 (ref 5–15)
BUN: 6 mg/dL (ref 6–20)
CO2: 31 mmol/L (ref 22–32)
Calcium: 9.1 mg/dL (ref 8.9–10.3)
Chloride: 98 mmol/L — ABNORMAL LOW (ref 101–111)
Creatinine, Ser: 0.61 mg/dL (ref 0.44–1.00)
GFR calc Af Amer: >60 mL/min (ref >60)
GFR calc non Af Amer: >60 mL/min (ref >60)
Glucose, Bld: 80 mg/dL (ref 65–99)
Potassium: 3.7 mmol/L (ref 3.5–5.1)
Sodium: 137 mmol/L (ref 135–145)

## 2017-07-03 MED ORDER — FLEET ENEMA 7-19 GM/118ML RE ENEM
1.0000 | ENEMA | Freq: Once | RECTAL | Status: AC | PRN
  Administered 2017-07-08: 1 via RECTAL
  Filled 2017-07-03: qty 1

## 2017-07-03 MED ORDER — LEVOTHYROXINE SODIUM 112 MCG PO TABS
112.0000 ug | ORAL_TABLET | Freq: Every day | ORAL | Status: DC
  Administered 2017-07-04 – 2017-07-25 (×21): 112 ug via ORAL
  Filled 2017-07-03 (×22): qty 1

## 2017-07-03 MED ORDER — ESCITALOPRAM OXALATE 10 MG PO TABS
20.0000 mg | ORAL_TABLET | Freq: Every day | ORAL | Status: DC
  Administered 2017-07-03 – 2017-07-25 (×23): 20 mg via ORAL
  Filled 2017-07-03 (×22): qty 2

## 2017-07-03 MED ORDER — CLONAZEPAM 0.5 MG PO TABS
0.2500 mg | ORAL_TABLET | Freq: Three times a day (TID) | ORAL | Status: DC | PRN
  Administered 2017-07-04 – 2017-07-14 (×4): 0.25 mg via ORAL
  Filled 2017-07-03 (×6): qty 1

## 2017-07-03 MED ORDER — TRAZODONE HCL 50 MG PO TABS: 25.0000 mg | ORAL_TABLET | Freq: Every evening | ORAL | Status: DC | PRN

## 2017-07-03 MED ORDER — DIPHENHYDRAMINE HCL 12.5 MG/5ML PO ELIX: 12.5000 mg | ORAL_SOLUTION | Freq: Four times a day (QID) | ORAL | Status: DC | PRN

## 2017-07-03 MED ORDER — ENOXAPARIN SODIUM 40 MG/0.4ML ~~LOC~~ SOLN
40.0000 mg | SUBCUTANEOUS | Status: DC
Start: 2017-07-03 — End: 2017-07-18
  Administered 2017-07-03 – 2017-07-17 (×12): 40 mg via SUBCUTANEOUS
  Filled 2017-07-03 (×13): qty 0.4

## 2017-07-03 MED ORDER — BISACODYL 10 MG RE SUPP
10.0000 mg | Freq: Every day | RECTAL | Status: DC | PRN
  Administered 2017-07-15: 10 mg via RECTAL
  Filled 2017-07-03: qty 1

## 2017-07-03 MED ORDER — FAMOTIDINE 10 MG PO TABS
10.0000 mg | ORAL_TABLET | Freq: Two times a day (BID) | ORAL | Status: DC
  Administered 2017-07-03 – 2017-07-25 (×44): 10 mg via ORAL
  Filled 2017-07-03 (×42): qty 1

## 2017-07-03 MED ORDER — ACETAMINOPHEN 325 MG PO TABS
325.0000 mg | ORAL_TABLET | ORAL | Status: DC | PRN
  Administered 2017-07-04 – 2017-07-20 (×7): 650 mg via ORAL
  Filled 2017-07-03 (×5): qty 2

## 2017-07-03 MED ORDER — PROPRANOLOL HCL 10 MG PO TABS
10.0000 mg | ORAL_TABLET | Freq: Two times a day (BID) | ORAL | Status: DC
  Administered 2017-07-03 – 2017-07-10 (×15): 10 mg via ORAL
  Filled 2017-07-03 (×15): qty 1

## 2017-07-03 MED ORDER — PROCHLORPERAZINE 25 MG RE SUPP
12.5000 mg | Freq: Four times a day (QID) | RECTAL | Status: DC | PRN
  Filled 2017-07-03: qty 1

## 2017-07-03 MED ORDER — PROCHLORPERAZINE EDISYLATE 5 MG/ML IJ SOLN
5.0000 mg | Freq: Four times a day (QID) | INTRAMUSCULAR | Status: DC | PRN
Start: 2017-07-03 — End: 2017-07-25

## 2017-07-03 MED ORDER — POLYETHYLENE GLYCOL 3350 17 G PO PACK
17.0000 g | PACK | Freq: Every day | ORAL | Status: DC | PRN
  Administered 2017-07-14 – 2017-07-21 (×2): 17 g via ORAL
  Filled 2017-07-03 (×2): qty 1

## 2017-07-03 MED ORDER — PREMIER PROTEIN SHAKE
2.0000 [oz_av] | Freq: Four times a day (QID) | ORAL | Status: DC
  Administered 2017-07-03 – 2017-07-25 (×56): 2 [oz_av] via ORAL
  Filled 2017-07-03 (×97): qty 325.31

## 2017-07-03 MED ORDER — GUAIFENESIN-DM 100-10 MG/5ML PO SYRP: 5.0000 mL | ORAL_SOLUTION | Freq: Four times a day (QID) | ORAL | Status: DC | PRN

## 2017-07-03 MED ORDER — ENOXAPARIN SODIUM 40 MG/0.4ML ~~LOC~~ SOLN
40.0000 mg | SUBCUTANEOUS | Status: DC
  Filled 2017-07-03: qty 0.4

## 2017-07-03 MED ORDER — QUETIAPINE FUMARATE 50 MG PO TABS
50.0000 mg | ORAL_TABLET | Freq: Every day | ORAL | Status: DC
  Administered 2017-07-03 – 2017-07-24 (×22): 50 mg via ORAL
  Filled 2017-07-03 (×22): qty 1

## 2017-07-03 MED ORDER — PROCHLORPERAZINE MALEATE 5 MG PO TABS
5.0000 mg | ORAL_TABLET | Freq: Four times a day (QID) | ORAL | Status: DC | PRN
  Administered 2017-07-21: 10 mg via ORAL
  Filled 2017-07-03 (×2): qty 2

## 2017-07-03 MED ORDER — ALUM & MAG HYDROXIDE-SIMETH 200-200-20 MG/5ML PO SUSP: 30.0000 mL | ORAL | Status: DC | PRN

## 2017-07-03 NOTE — Progress Notes (Signed)
Rehab admissions - I met with patient's BF at the bedside.  Patient was asleep.  BF in agreement to inpatient rehab.  Patient has been cleared by trauma service for acute inpatient rehab admission.  Bed available and will admit to inpatient rehab today.  Call me for questions.  #[phone] 
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
## 2017-07-03 NOTE — Progress Notes (Signed)
Pt medically stable per attending MD for admission to rehab today.  Per rehab admissions coordinator bed available, and pt will be admitted to CIR later today.    Reinaldo Raddle, RN, BSN  Trauma/Neuro ICU Case Manager [phone]
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
## 2017-07-03 NOTE — Progress Notes (Signed)
To room 14 [address] to enclosure bed for safety.Family educated on need at this point in time. Oriented Father to IP Rehab and safety plan. Pt with somnolence and periods of agitation.

## 2017-07-04 ENCOUNTER — Inpatient Hospital Stay (HOSPITAL_COMMUNITY): Payer: Medicaid Other

## 2017-07-04 ENCOUNTER — Inpatient Hospital Stay (HOSPITAL_COMMUNITY): Payer: Medicaid Other | Admitting: Physical Therapy

## 2017-07-04 ENCOUNTER — Inpatient Hospital Stay (HOSPITAL_COMMUNITY): Payer: Medicaid Other | Admitting: Occupational Therapy

## 2017-07-04 ENCOUNTER — Inpatient Hospital Stay (HOSPITAL_COMMUNITY): Payer: Medicaid Other | Admitting: Speech Pathology

## 2017-07-04 DIAGNOSIS — F0281 Dementia in other diseases classified elsewhere with behavioral disturbance: Secondary | ICD-10-CM

## 2017-07-04 DIAGNOSIS — S069X9S Unspecified intracranial injury with loss of consciousness of unspecified duration, sequela: Secondary | ICD-10-CM

## 2017-07-04 DIAGNOSIS — H4902 Third [oculomotor] nerve palsy, left eye: Secondary | ICD-10-CM

## 2017-07-04 DIAGNOSIS — S069XAS Unspecified intracranial injury with loss of consciousness status unknown, sequela: Secondary | ICD-10-CM

## 2017-07-04 LAB — CBC WITH DIFFERENTIAL/PLATELET
Basophils Absolute: 0 10*3/uL (ref 0.0–0.1)
Basophils Relative: 0 %
Eosinophils Absolute: 0.2 10*3/uL (ref 0.0–0.7)
Eosinophils Relative: 2 %
HCT: 41.8 % (ref 36.0–46.0)
Hemoglobin: 13.9 g/dL (ref 12.0–15.0)
Lymphocytes Relative: 20 %
Lymphs Abs: 2.1 10*3/uL (ref 0.7–4.0)
MCH: 30.1 pg (ref 26.0–34.0)
MCHC: 33.3 g/dL (ref 30.0–36.0)
MCV: 90.5 fL (ref 78.0–100.0)
Monocytes Absolute: 0.9 10*3/uL (ref 0.1–1.0)
Monocytes Relative: 9 %
Neutro Abs: 7 10*3/uL (ref 1.7–7.7)
Neutrophils Relative %: 69 %
Platelets: 375 10*3/uL (ref 150–400)
RBC: 4.62 MIL/uL (ref 3.87–5.11)
RDW: 12.2 % (ref 11.5–15.5)
WBC: 10.2 10*3/uL (ref 4.0–10.5)

## 2017-07-04 LAB — COMPREHENSIVE METABOLIC PANEL
ALT: 27 U/L (ref 14–54)
AST: 17 U/L (ref 15–41)
Albumin: 3.6 g/dL (ref 3.5–5.0)
Alkaline Phosphatase: 60 U/L (ref 38–126)
Anion gap: 7 (ref 5–15)
BUN: 8 mg/dL (ref 6–20)
CO2: 30 mmol/L (ref 22–32)
Calcium: 9.3 mg/dL (ref 8.9–10.3)
Chloride: 100 mmol/L — ABNORMAL LOW (ref 101–111)
Creatinine, Ser: 0.78 mg/dL (ref 0.44–1.00)
GFR calc Af Amer: >60 mL/min (ref >60)
GFR calc non Af Amer: >60 mL/min (ref >60)
Glucose, Bld: 90 mg/dL (ref 65–99)
Potassium: 4.2 mmol/L (ref 3.5–5.1)
Sodium: 137 mmol/L (ref 135–145)
Total Bilirubin: 0.5 mg/dL (ref 0.3–1.2)
Total Protein: 6.6 g/dL (ref 6.5–8.1)
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
## 2017-07-04 NOTE — Progress Notes (Signed)
Physical Therapy Session Note  Patient Details  Name: Ebony Jones MRN: [id number] Date of Birth: April 24, 1971  Today's Date: 07/04/2017 PT Individual Time: 1615-1700 PT Individual Time Calculation (min): 45 min   Short Term Goals: Week 1:  PT Short Term Goal 1 (Week 1): pt will transfer bed>< w/c with mod assist 3/4 trials PT Short Term Goal 2 (Week 1): pt will tolerate standing x 2 minutes during familiar functional activity PT Short Term Goal 3 (Week 1): pt will perform gait x 50' with min/mod assist PT Short Term Goal 4 (Week 1): pt will ascend/descend 4 steps 2 rails, mod assist  Skilled Therapeutic Interventions/Progress Updates:  Pt received supine in bed and agreeable to PT with significant effort to convince pt for OOB mobility. Supine>sit transfer with supervision assist and increased time. Pt reports need for use of rest room. Gait in room with mod assist and max cues for safety as pt demonstrated on ability to problem solve how to navigate around obstacles, and was noted to try to climb over recliner and wheel chair, rather than walk around.  Toileting with perineal hygiene following by hand hygiene with min assist and moderate cues for sequencing and safety.  Visual scanning for objects on the R. While sitting in WC. Pt noted to blindly reach for objects on right unless instructed by PT to turn head and compensate for visual field cut.    Gait in various envirnments with min-mod assist x 43f, and 325f Cues for object locating on the R with max cues for object name as well for improved visual scanning to the R throughout.   Pt returned to room and performed  ambulatory transfer to bed with mod. Sit>supine completed with supervision assist, and pt left supine in posey bed with call bell in reach and all needs met.     Therapy Documentation Precautions:  Precautions Precautions: Fall Precaution Comments: TBI; visual deficits, very impulsive Restrictions Weight Bearing  Restrictions: No    Vital Signs: Therapy Vitals Pulse Rate: 69 BP: (!) 99/59 Patient Position (if appropriate): Sitting Oxygen Therapy O2 Device: Not Delivered Pain: Pain Assessment Faces Pain Scale: Hurts whole lot Pain Type: Acute pain Pain Location: Head Pain Intervention(s): Medication (See eMAR)   See Function Navigator for Current Functional Status.   Therapy/Group: Individual Therapy  AuLorie Phenix1/28/2018, 5:42 PM
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
## 2017-07-04 NOTE — Progress Notes (Signed)
[facility] PHYSICAL MEDICINE & REHABILITATION     PROGRESS NOTE    Subjective/Complaints: Night appears to have been uneventful. Sleeping when I arrived. Has mild headache.   ROS: Limited due to cognitive/behavioral   Objective: Vital Signs: Blood pressure (!) 93/55, pulse 60, temperature 97.9 F (36.6 C), temperature source Oral, resp. rate 16, height 5\' 7"  (1.702 m), weight 66.5 kg (146 lb 9.7 oz), SpO2 98 %. No results found. Recent Labs    07/03/17 0445 07/04/17 0751  WBC 6.8 10.2  HGB 12.7 13.9  HCT 37.5 41.8  PLT 363 375   Recent Labs    07/03/17 0445  NA 137  K 3.7  CL 98*  GLUCOSE 80  BUN 6  CREATININE 0.61  CALCIUM 9.1   CBG (last 3)  No results for input(s): GLUCAP in the last 72 hours.  Wt Readings from Last 3 Encounters:  07/03/17 66.5 kg (146 lb 9.7 oz)  07/03/17 66.5 kg (146 lb 9.7 oz)  03/28/17 68 kg (150 lb)    Physical Exam:  Constitutional: She appearswell-developedand well-nourished.No distress. Up in bed. Pleasant and compliant. Restless at times with attempts to sit up at edge of bed. HENT:  Head:Normocephalicand atraumatic.  Eyes: Mydriasis right eye.  Neck:Normal range of motion.Neck supple.  Cardiovascular:RRR without murmur. No JVD .  Respiratory:CTA Bilaterally without wheezes or rales. Normal effort   IO:XBDZ.Bowel sounds are normal. She exhibitsno distension. There isno tenderness.  Musculoskeletal: She exhibits noedemaor tenderness.  Neurological: She isalert. Acranial nerve deficitis present.    Right non-reactive. dysconjugate gaze, left eye with decr medial movement.  Recalled that she was in rehab when given cues. Followed simple commands.  Moves all 4 limbs.  Skin: Skin iswarmand dry. She isnot diaphoretic.  Psychiatric: inattentive and impulsive, non-agitated.     Assessment/Plan: 1. Functional and cognitive deficits secondary to traumatic brain injury which require 3+ hours per day of  interdisciplinary therapy in a comprehensive inpatient rehab setting. Physiatrist is providing close team supervision and 24 hour management of active medical problems listed below. Physiatrist and rehab team continue to assess barriers to discharge/monitor patient progress toward functional and medical goals.  Function:  Bathing Bathing position      Bathing parts      Bathing assist        Upper Body Dressing/Undressing Upper body dressing                    Upper body assist        Lower Body Dressing/Undressing Lower body dressing                                  Lower body assist        Toileting Toileting          Toileting assist     Transfers Chair/bed transfer             Locomotion Ambulation           Wheelchair          Cognition Comprehension Comprehension assist level: Follows complex conversation/direction with extra time/assistive device  Expression Expression assist level: Expresses complex ideas: With extra time/assistive device  Social Interaction Social Interaction assist level: Interacts appropriately with others with medication or extra time (anti-anxiety, antidepressant).  Problem Solving    Memory     Medical Problem List and Plan: 1.Functional and cognitive deficitssecondary to TBI -  begin therapies today -RLAS V 2. DVT Prophylaxis/Anticoagulation: Mechanical:Sequential compression devices, below kneeBilateral lower extremities 3.Headaches/Pain Management:tylenol prn 4. Mood:currently has no awareness of accident or deficits. LCSW to follow for now and complete evaluation when appropriate 5. Neuropsych: This patientis notcapable of making decisions on herown behalf. -working to re-establish sleep-wake cycle    -tolerated seroquel well last night. Appears to have helped -continue     -sleep chart, consider stimulant for day time  arousal -maintain enclosure bed for patient safety, reviewed with husband 6. Skin/Wound Care:routine pressure relief measures. 7. Fluids/Electrolytes/Nutrition:[place] po intake   -labs pending 8. ABLA: resolving.   -hgb up to 13.9 today 10. Optho: mydriasis right pupil, left CN III palsy   -consvt care, patching recommended by optho   -outpt neuro-eye follow up at discharge depending upon recovery     LOS (Days) 1 A FACE TO Ebony T, MD 07/04/2017 8:45 AM

## 2017-07-04 NOTE — Progress Notes (Signed)
Pt resting in bed quietly. Easily aroused. Chews pills that are in puree and swallows them with thin liquids. Enclosure bed for safety. callbell within reach. Uneventful night. Will continue to monitor.
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
## 2017-07-04 NOTE — Significant Event (Signed)
Attempt to give Lovenox subq. States" not taking "no  F..ingl shot shot". Will attempt later
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
## 2017-07-05 ENCOUNTER — Inpatient Hospital Stay (HOSPITAL_COMMUNITY): Payer: Medicaid Other

## 2017-07-05 ENCOUNTER — Inpatient Hospital Stay (HOSPITAL_COMMUNITY): Payer: Medicaid Other | Admitting: Occupational Therapy

## 2017-07-05 ENCOUNTER — Inpatient Hospital Stay (HOSPITAL_COMMUNITY): Payer: Medicaid Other | Admitting: Speech Pathology

## 2017-07-05 NOTE — Progress Notes (Signed)
Occupational Therapy Session Note  Patient Details  Name: Ebony Jones MRN: [id number] Date of Birth: 1971/03/20  Today's Date: 07/05/2017 OT Individual Time: 1455-1534 OT Individual Time Calculation (min): 39 min    Short Term Goals: Week 1:  OT Short Term Goal 1 (Week 1): Pt will tranfser to and from toilet with mi nA  OT Short Term Goal 2 (Week 1): Pt will don LB clothing with min A  OT Short Term Goal 3 (Week 1): Pt will locate items to her right using compensatory strageties with mod cuing  OT Short Term Goal 4 (Week 1): Pt will be oriented x4 with mod environmental cues  Skilled Therapeutic Interventions/Progress Updates:    Pt greeted seated EOB with significant other present reporting urgent need to go to the bathroom. Pt impulsive to stand before pathway cleaed requiring max verbal and tactile cues to wait until safe to stand. Pt ambulated to bathroom with mod A 2/2 multiple LOB. Verbal cues for pathfinding 2/2 visual and perceptual deficits. Pt attempted to sit down before she was at a safe distance and required max A to safely descend onto toilet. Pt voided bladder and completed peri-care with set-up. Mod A to ambulate to the sink to wash hands with verbal cues to sequence activity and hand over hand to locate soap on R side and paper towels on L. W/c brought behind pt and pt brought to dynavision room. Pt needed hand over hand A initially to locate buttons, often undershooting. Pt able to sustain attention to dynavision task to hit 3 buttons in a row with max instructional cues in quiet environment. Pt needed to be redirected often 2/2 stating she was hungry and talking about all of the different food items she wanted to eat. Pt returned to enclosure bed at end of session and left with bed secured and needs met.  Therapy Documentation Precautions:  Precautions Precautions: Fall Precaution Comments: TBI; visual deficits, very impulsive Restrictions Weight Bearing Restrictions:  No ADL: ADL ADL Comments: see functional naviagtor  See Function Navigator for Current Functional Status.   Therapy/Group: Individual Therapy  Valma Cava 07/05/2017, 3:34 PM

## 2017-07-05 NOTE — Progress Notes (Signed)
[facility] PHYSICAL MEDICINE & REHABILITATION     PROGRESS NOTE    Subjective/Complaints: No issues overnight. Refused lovenox injection yesterday. Mild headache this morning  ROS: Limited due to cognitive/behavioral   Objective: Vital Signs: Blood pressure 97/61, pulse 64, temperature 98.3 F (36.8 C), temperature source Oral, resp. rate 17, height 5\' 7"  (1.702 m), weight 66.5 kg (146 lb 9.7 oz), SpO2 99 %. No results found. Recent Labs    07/03/17 0445 07/04/17 0751  WBC 6.8 10.2  HGB 12.7 13.9  HCT 37.5 41.8  PLT 363 375   Recent Labs    07/03/17 0445 07/04/17 0751  NA 137 137  K 3.7 4.2  CL 98* 100*  GLUCOSE 80 90  BUN 6 8  CREATININE 0.61 0.78  CALCIUM 9.1 9.3   CBG (last 3)  No results for input(s): GLUCAP in the last 72 hours.  Wt Readings from Last 3 Encounters:  07/03/17 66.5 kg (146 lb 9.7 oz)  07/03/17 66.5 kg (146 lb 9.7 oz)  03/28/17 68 kg (150 lb)    Physical Exam:  Constitutional: She appearswell-developedand well-nourished.No distress.in enclosure bed HENT:  Head:Normocephalicand atraumatic.  Eyes: Mydriasis right eye.  Neck:Normal range of motion.Neck supple.  Cardiovascular:RRR without murmur. No JVD  .  Respiratory:CTA Bilaterally without wheezes or rales. Normal effort    OV:FIEP.Bowel sounds are normal. She exhibitsno distension. There isno tenderness.  Musculoskeletal: She exhibits noedemaor tenderness.  Neurological: She isalert. Acranial nerve deficitis present.   dysconjugate gaze, left eye with decr medial movement.  Recalled that she was in rehab when given cues. Followed simple commands.  Moves all 4 limbs.  Skin: Skin iswarmand dry.  Psychiatric: inattentive and impulsive, non-agitated.     Assessment/Plan: 1. 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 Physiatrist is  providing close team supervision and 24 hour management of active medical problems listed below. Physiatrist and rehab team continue to assess barriers to discharge/monitor patient progress toward functional and medical goals.  Function:  Bathing Bathing position      Bathing parts      Bathing assist        Upper Body Dressing/Undressing Upper body dressing                    Upper body assist        Lower Body Dressing/Undressing Lower body dressing                                  Lower body assist        Toileting Toileting   Toileting steps completed by patient: Adjust clothing prior to toileting, Performs perineal hygiene, Adjust clothing after toileting Toileting steps completed by helper: Adjust clothing prior to toileting, Performs perineal hygiene, Adjust clothing after toileting Toileting Assistive Devices: Grab bar or rail  Toileting assist Assist level: Supervision or verbal cues   Transfers Chair/bed transfer   Chair/bed transfer method: Stand pivot Chair/bed transfer assist level: Maximal assist (Pt 25 - 49%/lift and lower) Chair/bed transfer assistive device: Armrests     Locomotion Ambulation     Max distance: 25 Assist level: Moderate assist (Pt 50 - 74%)   Wheelchair   Type: Manual Max wheelchair distance: 10 Assist Level: Total assistance (Pt < 25%)  Cognition Comprehension Comprehension assist level: Understands basic 25 - 49%  of the time/ requires cueing 50 - 75% of the time  Expression Expression assist level: Expresses basic 25 - 49% of the time/requires cueing 50 - 75% of the time. Uses single words/gestures.  Social Interaction Social Interaction assist level: Interacts appropriately 25 - 49% of time - Needs frequent redirection.  Problem Solving Problem solving assist level: Solves basic 25 - 49% of the time - needs direction more than half the time to initiate, plan or complete simple activities  Memory Memory  assist level: Recognizes or recalls less than 25% of the time/requires cueing greater than 75% of the time   Medical Problem List and Plan: 1.Functional and cognitive deficitssecondary to TBI -continue therapies -RLAS V 2. DVT Prophylaxis/Anticoagulation: Mechanical:Sequential compression devices, below kneeBilateral lower extremities 3.Headaches/Pain Management:tylenol prn 4. Mood:currently has no awareness of accident or deficits. LCSW to follow for now and complete evaluation when appropriate 5. Neuropsych: This patientis notcapable of making decisions on herown behalf. -working to re-establish sleep-wake cycle    -tolerated seroquel well last night. Appears to have helped -continue     -sleep chart, consider stimulant for day time arousal -continue enclosure bed for patient safety, reviewed with husband 6. Skin/Wound Care:routine pressure relief measures. 7. Fluids/Electrolytes/Nutrition:[place] po intake   -labs reviewed and wnl 8. ABLA: resolving.   -hgb up to 13.9 today 10. Optho: mydriasis right pupil, left CN III palsy   -consvt care, patching recommended by optho   -outpt neuro-eye follow up at discharge depending upon recovery     LOS (Days) 2 A FACE TO [place] T, MD 07/05/2017 8:36 AM

## 2017-07-05 NOTE — Progress Notes (Signed)
Occupational Therapy Session Note  Patient Details  Name: Ebony Jones MRN: [id number] Date of Birth: [birth year]  Today's Date: 07/05/2017 OT Individual Time: 0349-1791 OT Individual Time Calculation (min): 71 min    Short Term Goals: Week 1:  OT Short Term Goal 1 (Week 1): Pt will tranfser to and from toilet with mi nA  OT Short Term Goal 2 (Week 1): Pt will don LB clothing with min A  OT Short Term Goal 3 (Week 1): Pt will locate items to her right using compensatory strageties with mod cuing  OT Short Term Goal 4 (Week 1): Pt will be oriented x4 with mod environmental cues  Skilled Therapeutic Interventions/Progress Updates:    Upon entering the room, pt seated in wheelchair with breakfast tray. Pt declining to eat more and reports feeling unwell and c/o headache. RN notified and medication given. Pt ambulating back to enclosure bed with mod A secondary to balance and impulsiveness with mobility. Pt holding head in heads and very restless. Pt also incontinent and clothing wet with pt unaware. OT providing pt with wash cloth and she washed face, buttocks, and peri area. Pt agreeable to change clothing with therapist handing items to pt in vale bed. Pt required assistance to thread initially but she pulled over B hips. Pt also changing shirt and putting on jacket. Pt with confabulation throughout session requiring max multimodal cues for redirection and orientation. Pt requesting music this session and 15 minutes of music played with pt's restlessness decreasing and pt asleep as therapist exiting the room. Enclosure bed secure.  Therapy Documentation Precautions:  Precautions Precautions: Fall Precaution Comments: TBI; visual deficits, very impulsive Restrictions Weight Bearing Restrictions: No General:   Vital Signs:   Pain:   ADL: ADL ADL Comments: see functional naviagtor Vision   Perception    Praxis   Exercises:   Other Treatments:    See Function Navigator for  Current Functional Status.   Therapy/Group: Individual Therapy  Gypsy Decant 07/05/2017, 9:36 AM

## 2017-07-05 NOTE — Progress Notes (Signed)
Physical Therapy Session Note  Patient Details  Name: Ebony Jones MRN: [id number] Date of Birth: Oct 04, 1970  Today's Date: 07/05/2017 PT Individual Time: 1040-1140 PT Individual Time Calculation (min): 60 min   Short Term Goals: Week 1:  PT Short Term Goal 1 (Week 1): pt will transfer bed>< w/c with mod assist 3/4 trials PT Short Term Goal 2 (Week 1): pt will tolerate standing x 2 minutes during familiar functional activity PT Short Term Goal 3 (Week 1): pt will perform gait x 50' with min/mod assist PT Short Term Goal 4 (Week 1): pt will ascend/descend 4 steps 2 rails, mod assist  Skilled Therapeutic Interventions/Progress Updates:    Patient seen with boyfriend in room.  C/o fatigue and not up to getting up, but engaged with encouragement and up out of vail bed min A.  Patient ambulated in room min to mod A for direction due to visual deficits and delayed reaction to commands for direction changes.  In w/c pushed by staff to dayroom.  Engaged visually at [facility] and attempt to ID ornament with touch and some sit to stand to manipulate other objects in attempt to ID by feel, but pt more successful when using a written cue such as for color pencils.  Transferred to Nu Step for seated UE/LE x 7 min at level 2 frequent stops due to distracted by conversation.  Gait to room x 80' mod A HHA.  Patient seated in room using spoon to spread peanut butter on a cracker with min A for placing spoon and mod cues to attend.  Assisted to bathroom mod A ambulation and min A for balance during don/doff pants.  Back to bed end of session with vail secured and significant other in the room.   Therapy Documentation Precautions:  Precautions Precautions: Fall Precaution Comments: TBI; visual deficits, very impulsive Restrictions Weight Bearing Restrictions: No Pain: Pain Assessment Pain Assessment: Faces Pain Score: 3  Faces Pain Scale: Hurts little more Pain Type: Acute pain Pain Location:  Generalized Pain Orientation: Anterior Pain Descriptors / Indicators: Discomfort;Aching Pain Onset: With Activity Pain Intervention(s): Repositioned;Rest   See Function Navigator for Current Functional Status.   Therapy/Group: Individual Therapy  Reginia Naas 07/05/2017, 12:53 PM

## 2017-07-05 NOTE — Progress Notes (Signed)
Speech Language Pathology Daily Session Note  Patient Details  Name: Ebony Jones MRN: [id number] Date of Birth: [birth year]  Today's Date: 07/05/2017 SLP Individual Time: 1300-1355 SLP Individual Time Calculation (min): 55 min  Short Term Goals: Week 1: SLP Short Term Goal 1 (Week 1): Patient will demonstrate sustained attention to task for ~5 minutes with Max A verbal cues for redirection.  SLP Short Term Goal 2 (Week 1): Patient will consume trials of regular textures with efficient mastication without overt s/s of aspiration over 2 sessions prior to upgrade.  SLP Short Term Goal 3 (Week 1): Patient will demonstrate functional problem solving for basic and familair tasks with Max A multimodal cues.  SLP Short Term Goal 4 (Week 1): Patient will verbalize wants/needs within a familair context with Max A multimodal cues.   Skilled Therapeutic Interventions: Skilled treatment session focused on cognitive goals. Upon arrival, patient awake in enclosure bed and reported she was "starving." SLP facilitated session by providing patient with lunch meal of Dys. 3 textures with thin liquids. Patient required Mod-Max A verbal cues for use of small bites/sips with intermittent wet vocal quality noted that cleared with spontaneous throat clears. Recommend patient continue current diet. Suspect increased s/s of penetration today due to increased verbosity with increased language of confusion with poor topic maintenance. Patient required Max A verbal cues for problem solving with tray set-up due to visual impairments and for sustained to self-feeding for increment of ~2  Minutes. Patient also required total A for orientation to situation and time but independently recalled she was at the hospital. Patient left supine in enclosure bed with family present. Continue with current plan of care.      Function:  Eating Eating   Modified Consistency Diet: Yes Eating Assist Level: Set up assist for;More than  reasonable amount of time;Supervision or verbal cues;Help managing cup/glass   Eating Set Up Assist For: Opening containers;Cutting food       Cognition Comprehension Comprehension assist level: Understands basic 25 - 49% of the time/ requires cueing 50 - 75% of the time  Expression   Expression assist level: Expresses basic 25 - 49% of the time/requires cueing 50 - 75% of the time. Uses single words/gestures.  Social Interaction Social Interaction assist level: Interacts appropriately 25 - 49% of time - Needs frequent redirection.  Problem Solving Problem solving assist level: Solves basic 25 - 49% of the time - needs direction more than half the time to initiate, plan or complete simple activities  Memory Memory assist level: Recognizes or recalls less than 25% of the time/requires cueing greater than 75% of the time    Pain No reports of pain   Therapy/Group: Individual Therapy  Catina Nuss, [place] 07/05/2017, 3:12 PM

## 2017-07-06 ENCOUNTER — Inpatient Hospital Stay (HOSPITAL_COMMUNITY): Payer: Medicaid Other | Admitting: Occupational Therapy

## 2017-07-06 ENCOUNTER — Inpatient Hospital Stay (HOSPITAL_COMMUNITY): Payer: Medicaid Other

## 2017-07-06 MED ORDER — METHYLPHENIDATE HCL 5 MG PO TABS
5.0000 mg | ORAL_TABLET | Freq: Two times a day (BID) | ORAL | Status: DC
  Administered 2017-07-06 – 2017-07-10 (×8): 5 mg via ORAL
  Filled 2017-07-06 (×8): qty 1

## 2017-07-06 NOTE — Progress Notes (Signed)
Speech Language Pathology Daily Session Note  Patient Details  Name: Ebony Jones MRN: [id number] Date of Birth: 07/16/1971  Today's Date: 07/06/2017 SLP Individual Time: 9767-3419 SLP Individual Time Calculation (min): 55 min  Short Term Goals: Week 1: SLP Short Term Goal 1 (Week 1): Patient will demonstrate sustained attention to task for ~5 minutes with Max A verbal cues for redirection.  SLP Short Term Goal 2 (Week 1): Patient will consume trials of regular textures with efficient mastication without overt s/s of aspiration over 2 sessions prior to upgrade.  SLP Short Term Goal 3 (Week 1): Patient will demonstrate functional problem solving for basic and familair tasks with Max A multimodal cues.  SLP Short Term Goal 4 (Week 1): Patient will verbalize wants/needs within a familair context with Max A multimodal cues.   Skilled Therapeutic Interventions: Skilled treatment session focused on cognitive goals. Initially, pt resistant to working with SLP stating that she was too tired and that her speech is "fine." SLP facilitated session by providing patient with Total A to Max A verbal cues and binary verbal choice prompts for orientation (oriented self only this session) and recall of biographical information including names of children, grandchild, family, and significant other. Max A for basic + functional problem-solving re: time calculations for therapies. Patient left supine in enclosure bed with family present. Pt attended to rote language tasks for ~5 minutes given Max A verbal cues to maintain attention and complete tasks. Required frequent verbal cues for redirection. Pt remains tangential and confabulatory + poor topic maintenance/focused attention. Continue with current plan of care.   Function:  Cognition Comprehension Comprehension assist level: Understands basic 25 - 49% of the time/ requires cueing 50 - 75% of the time  Expression   Expression assist level: Expresses basic 25 -  49% of the time/requires cueing 50 - 75% of the time. Uses single words/gestures.  Social Interaction Social Interaction assist level: Interacts appropriately 25 - 49% of time - Needs frequent redirection.  Problem Solving Problem solving assist level: Solves basic 25 - 49% of the time - needs direction more than half the time to initiate, plan or complete simple activities  Memory Memory assist level: Recognizes or recalls less than 25% of the time/requires cueing greater than 75% of the time    Pain Pain Assessment Pain Assessment: No/denies pain  Therapy/Group: Individual Therapy  Layci Stenglein A Mann Skaggs 07/06/2017, 1:03 PM
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
## 2017-07-06 NOTE — Progress Notes (Addendum)
Physical Therapy Session Note  Patient Details  Name: Ebony Jones MRN: [id number] Date of Birth: [birth year]  Today's Date: 07/06/2017 PT Individual Time: 0830-0930 PT Individual Time Calculation (min): 60 min   Short Term Goals: Week 1:  PT Short Term Goal 1 (Week 1): pt will transfer bed>< w/c with mod assist 3/4 trials PT Short Term Goal 2 (Week 1): pt will tolerate standing x 2 minutes during familiar functional activity PT Short Term Goal 3 (Week 1): pt will perform gait x 50' with min/mod assist PT Short Term Goal 4 (Week 1): pt will ascend/descend 4 steps 2 rails, mod assist  Skilled Therapeutic Interventions/Progress Updates:    Patient in supine in vail bed.  Awoke easily and slowly came to sitting with cue and S.  Seated to eat breakfast with mod verbal and tactlie cues due to visual deficits with set up.  Patient ambulated to bathroom mod A and toileted with mod a for transfer.  Patient in w/c pushed about 13' with UE's limited due to fatigue and sustained attention.  Standing balance/endurance for reading signs and placing ornaments on tree with hand over hand assist.  Car transfer mod A due to poor safety stepping with foot into car prior to sitting; max cues and redirection for safe transfer.  Gait on ramp with mod A for direction and balance.  Negotiated 4 steps with rails and mod A for safety especially for descent due to visual deficits max cues for safe technique to use somatosensory sense to note depth of steps.  Work on reading items on bean bags and gait with grocery cart to retrieve bean bags with min /mod A.  Total A in w/c back to room.  Patient left in vail bed end of session with boyfriend present and initiated handoff to speech therapist.   Therapy Documentation Precautions:  Precautions Precautions: Fall Precaution Comments: TBI; visual deficits, very impulsive Restrictions Weight Bearing Restrictions: No Pain: Pain Assessment Pain Assessment: No/denies  pain Faces Pain Scale: Hurts little more Pain Type: Acute pain Pain Location: Head Pain Descriptors / Indicators: Headache Pain Intervention(s): Repositioned   See Function Navigator for Current Functional Status.   Therapy/Group: Individual Therapy  Reginia Naas 07/06/2017, 1:06 PM

## 2017-07-06 NOTE — Care Management (Signed)
Inpatient [facility] Individual Statement of Services  Patient Name:  Ebony Jones  Date:  07/06/2017  Welcome to the [facility].  Our goal is to provide you with an individualized program based on your diagnosis and situation, designed to meet your specific needs.  With this comprehensive rehabilitation program, you will be expected to participate in at least 3 hours of rehabilitation therapies Monday-Friday, with modified therapy programming on the weekends.  Your rehabilitation program will include the following services:  Physical Therapy (PT), Occupational Therapy (OT), Speech Therapy (ST), 24 hour per day rehabilitation nursing, Therapeutic Recreaction (TR), Neuropsychology, Case Management (Social Worker), Rehabilitation Medicine, Nutrition Services and Pharmacy Services  Weekly team conferences will be held on Tuesdays to discuss your progress.  Your Social Worker will talk with you frequently to get your input and to update you on team discussions.  Team conferences with you and your family in attendance may also be held.  Expected length of stay: 21-24 days    Overall anticipated outcome: supervision  Depending on your progress and recovery, your program may change. Your Social Worker will coordinate services and will keep you informed of any changes. Your Social Worker's name and contact numbers are listed  below.  The following services may also be recommended but are not provided by the [facility] will be made to provide these services after discharge if needed.  Arrangements include referral to agencies that provide these services.  Your insurance has been verified to be:  Medicaid Your primary doctor is:  TBD  Pertinent information will be shared with your doctor and your insurance  company.  Social Worker:  [place], [place] or ([phone]   Information discussed with and copy given to patient by: Lennart Pall, 07/06/2017, 3:39 PM

## 2017-07-06 NOTE — IPOC Note (Signed)
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 ([place])     Functional Problem List: Nursing Safety, Behavior, Perception  PT Balance, Behavior, Endurance, Motor, Pain, Safety  OT Balance, Perception, Behavior, Safety, Cognition, Edema, Endurance, Motor, Pain  SLP Behavior  TR         Basic ADL's: OT Grooming, Bathing, Dressing, Toileting, Eating     Advanced  ADL's: OT       Transfers: PT Bed Mobility, Bed to Chair, Car, Manufacturing systems engineer, Metallurgist: PT Ambulation, Emergency planning/management officer, Stairs     Additional Impairments: OT None  SLP Swallowing, Communication, Social Cognition expression Social Interaction, Problem Solving, Memory, Attention, Awareness  TR      Anticipated Outcomes Item Anticipated Outcome  Self Feeding supervision   Swallowing  Supervision    Basic self-care  supervision  Toileting  supervision   Bathroom Transfers supervision  Bowel/Bladder  will remain continent with q 2 hour toileting  Transfers  supervision basic and car  Locomotion  supervision gait x 150' with LRAD and min assist up/down 12 steps 1 rail  Communication  Min A   Cognition  Min A   Pain  pain level at acceptable level while in IP Rehab  Safety/Judgment  Judgement and safety will improve as much as possible   Therapy Plan: PT Intensity: Minimum of 1-2 x/day ,45 to 90 minutes PT Frequency: 5 out of 7 days PT Duration Estimated Length of Stay: 20-24 OT Intensity: Minimum of 1-2 x/day, 45 to 90 minutes OT Frequency: 5 out of 7 days OT Duration/Estimated Length of Stay: ~20 days SLP Intensity: Minumum of  1-2 x/day, 30 to 90 minutes SLP Frequency: 1 to 3 out of 7 days SLP Duration/Estimated Length of Stay: 21-24 days     Team Interventions: Nursing Interventions Patient/Family Education, Dysphagia/Aspiration Development worker, community, Medication Management, Psychosocial Support, Cognitive Remediation/Compensation  PT interventions Ambulation/gait training, Training and development officer, Cognitive remediation/compensation, Discharge planning, Community reintegration, DME/adaptive equipment instruction, Functional mobility training, Patient/family education, Pain management, Neuromuscular re-education, Psychosocial support, Splinting/orthotics, Therapeutic Exercise, Therapeutic Activities, Stair training, UE/LE Strength taining/ROM, UE/LE Coordination activities, Visual/perceptual remediation/compensation, Wheelchair propulsion/positioning  OT Interventions Training and development officer, Discharge planning, Functional electrical stimulation, Pain management, Therapeutic Activities, UE/LE Coordination activities, Self Care/advanced ADL retraining, Visual/perceptual remediation/compensation, Therapeutic Exercise, Skin care/wound managment, Patient/family education, Functional mobility training, Disease mangement/prevention, Cognitive remediation/compensation, Academic librarian, Engineer, drilling, Psychosocial support, Wheelchair propulsion/positioning, UE/LE Strength taining/ROM  SLP Interventions Cognitive remediation/compensation, Environmental controls, Internal/external aids, Speech/Language facilitation, English as a second language teacher, Dysphagia/aspiration precaution training, Functional tasks, Patient/family education, Therapeutic Activities  TR Interventions    SW/CM Interventions Discharge Planning, Psychosocial Support, Patient/Family Education   Barriers to Discharge MD  Medical stability and Behavior  Nursing Behavior ability for self care post TBI  PT Inaccessible home environment boyfriend  stated thee was "not way a rail can be installed at these steps, because they are brick"  OT      SLP      SW       Team Discharge Planning: Destination: PT-Home ,OT- Home , SLP-Home Projected Follow-up: PT-Home health PT, OT-  Outpatient OT, SLP-24 hour supervision/assistance, Outpatient SLP, Home Health SLP  Projected Equipment Needs: PT-To be determined, OT- To be determined, SLP-None recommended by SLP Equipment Details: PT- , OT-  Patient/family involved in discharge planning: PT- Patient, Family member/caregiver,  OT-Patient, SLP-Patient  MD ELOS: 20-22 days Medical Rehab Prognosis:  Excellent Assessment: The patient has been admitted for CIR therapies with the diagnosis of TBI with polytrauma. The team will be addressing functional mobility, strength, stamina, balance, safety, adaptive techniques and equipment, self-care, bowel and bladder mgt, patient and caregiver education, neuromuscular reeducation, cognitive perceptual training, behavioral management, pain control, community reentry, communication. Goals have been set at supervision for basic self-care, ADLs, transfers and mobility.  Min assist for cognition.    Meredith Staggers, MD, FAAPMR      See Team Conference Notes for weekly updates to the plan of care

## 2017-07-06 NOTE — Plan of Care (Signed)
  Not Progressing Consults [facility] BRAIN INJURY PATIENT EDUCATION Description Description: See Patient Education module for eduction specifics 07/06/2017 5809 - Not Progressing by Ginette Pitman, RN RH SAFETY RH STG ADHERE TO SAFETY PRECAUTIONS W/ASSISTANCE/DEVICE Description STG Adhere to Safety Precautions With Assistance/Device. 07/06/2017 1732 - Not Progressing by Ginette Pitman, RN RH STG DECREASED RISK OF FALL WITH ASSISTANCE Description STG Decreased Risk of Fall With Assistance. 07/06/2017 1732 - Not Progressing by Ginette Pitman, RN RH STG [place] 07/06/2017 1732 - Not Progressing by Ginette Pitman, RN RH OTHER STG SAFETY GOALS W/ASSIST Description Other STG Safety Goals With Assistance. 07/06/2017 1732 - Not Progressing by Ginette Pitman, RN RH COGNITION-NURSING RH STG USES MEMORY AIDS/STRATEGIES W/ASSIST TO PROBLEM SOLVE Description STG Uses Memory Aids/Strategies With Assistance to Problem Solve. 07/06/2017 1732 - Not Progressing by Ginette Pitman, RN RH STG ANTICIPATES NEEDS/CALLS FOR ASSIST W/ASSIST/CUES Description STG Anticipates Needs/Calls for Assist With Assistance/Cues. 07/06/2017 1732 - Not Progressing by Ginette Pitman, RN  Cognitive impairments cont to impede progress

## 2017-07-06 NOTE — Progress Notes (Signed)
Occupational Therapy Session Note  Patient Details  Name: Ebony Jones MRN: [id number] Date of Birth: 09/16/1970  Today's Date: 07/06/2017 OT Individual Time: 6468-0321 and 2248-2500 OT Individual Time Calculation (min): 48 min and 28 min   Short Term Goals: Week 1:  OT Short Term Goal 1 (Week 1): Pt will tranfser to and from toilet with mi nA  OT Short Term Goal 2 (Week 1): Pt will don LB clothing with min A  OT Short Term Goal 3 (Week 1): Pt will locate items to her right using compensatory strageties with mod cuing  OT Short Term Goal 4 (Week 1): Pt will be oriented x4 with mod environmental cues  Skilled Therapeutic Interventions/Progress Updates:    Tx focus on safety awareness, ADL retraining, attention, orientation, and visual tracking.   Pt greeted in enclosure bed via SLP handoff. Significant other Ebony Jones was present. Pt disoriented to time and place. She ambulated with HHA and Mod A to bathroom to void bladder, requiring multimodal cues for locating bathroom and safely backing up to toilet. Pt washing hands with sanitizer once seated EOB with encouragement. Pt dressing at sit<stand level from EOB, requiring overall steady assist for balance. Max questioning/instructional cues for correctly orienting clothing and problem solving. Pt fixing her hair into a ponytail while standing at sink (with steady assist), then requesting to eat ice cream. Was able to maintain sustained attention to task (once seated upright) for 2 minute intervals. Required assist for taking smaller bites and recognizing spills. We conversed about her 7 dogs at home. Pt often asking Ebony Jones to assist her with memory deficits. Educated Ebony Jones on methods to facilitate pts problem solving during memory recall. Pt was repositioned for comfort in bed and left with all needs within reach, secured safely in enclosure bed.     2nd Session 1:1 tx (28 min) Tx focus on awareness, attention, and self organization/sequencing during  functional tasks.   Pt greeted in enclosure bed with Ebony Jones present. Agreeable to tx with encouragement. Pt ambulating with HHA and Mod A to bathroom, once again requiring directional cues and safety cues to accurately locate and sit on toilet. Pt washing hands at sink while standing afterwards. She took 1 seated rest break EOB, then pt ambulated to family room with HHA, 1 major LOB to the Rt with Max A to recover. Ebony Jones was providing w/c follow for safety. Pt tolerating moderately stimulating environment while ambulating, unable to comprehend Lt/Rt verbal cues, but did well with visual ones. Pt sat on couch in family room. Sit<stand with Mod A. Pt preparing peanut butter crackers while sitting with max multimodal cues for initiation, attention, and sequencing. Pt able to read "Petra Kuba Maid" and "Peanut Butter." Questioning extent of her visual deficits due to misalignment of both eyes and discrepancies in pupillary sizes. Pt then ambulated back to room in manner as written above. Able to pathfind her way back to the room with min vcs. Pt secured safely in enclosure bed at time of departure. Ebony Jones present.     Therapy Documentation Precautions:  Precautions Precautions: Fall Precaution Comments: TBI; visual deficits, very impulsive Restrictions Weight Bearing Restrictions: No Pain: Pt c/o LE pain during both sessions. RN made aware.    ADL: ADL ADL Comments: see functional naviagtor     See Function Navigator for Current Functional Status.   Therapy/Group: Individual Therapy  Jedidiah Demartini A Donatella Walski 07/06/2017, 12:14 PM

## 2017-07-06 NOTE — Progress Notes (Signed)
[facility] PHYSICAL MEDICINE & REHABILITATION     PROGRESS NOTE    Subjective/Complaints: Slept well.  Slow to rise this morning.  Remains somewhat distracted  ROS: Limited due to cognitive/behavioral   Objective: Vital Signs: Blood pressure 103/65, pulse 62, temperature 98.4 F (36.9 C), temperature source Oral, resp. rate 16, height 5\' 7"  (1.702 m), weight 66.5 kg (146 lb 9.7 oz), SpO2 100 %. No results found. Recent Labs    07/04/17 0751  WBC 10.2  HGB 13.9  HCT 41.8  PLT 375   Recent Labs    07/04/17 0751  NA 137  K 4.2  CL 100*  GLUCOSE 90  BUN 8  CREATININE 0.78  CALCIUM 9.3   CBG (last 3)  No results for input(s): GLUCAP in the last 72 hours.  Wt Readings from Last 3 Encounters:  07/03/17 66.5 kg (146 lb 9.7 oz)  07/03/17 66.5 kg (146 lb 9.7 oz)  03/28/17 68 kg (150 lb)    Physical Exam:  Constitutional: She appearswell-developedand well-nourished.No distress.in enclosure bed HENT:  Head:Normocephalicand atraumatic.  Eyes: Mydriasis right eye.  Neck:Normal range of motion.Neck supple.  Cardiovascular:RRR without murmur. No JVD   .  Respiratory:CTA Bilaterally without wheezes or rales. Normal effort   HU:DJSH.Bowel sounds are normal. She exhibitsno distension. There isno tenderness.  Musculoskeletal: She exhibits noedemaor tenderness.  Neurological: Slow to arouse this morning.  Acranial nerve deficitis present.   dysconjugate gaze, left eye with decr medial movement.  Recalled that she was in rehab when given cues. Followed simple commands.  Moves all 4 limbs.  Skin: Skin iswarmand dry.  Psychiatric: inattentive  , non-agitated.     Assessment/Plan: 1. 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 Physiatrist is providing close team supervision and 24 hour management of active medical problems  listed below. Physiatrist and rehab team continue to assess barriers to discharge/monitor patient progress toward functional and medical goals.  Function:  Bathing Bathing position   Position: Other (comment)(in vale bed)  Bathing parts Body parts bathed by patient: Front perineal area, Buttocks, Right lower leg, Left lower leg(4/4)    Bathing assist Assist Level: Set up, Supervision or verbal cues   Set up : To obtain items  Upper Body Dressing/Undressing Upper body dressing   What is the patient wearing?: Pull over shirt/dress, Button up shirt     Pull over shirt/dress - Perfomed by patient: Put head through opening, Pull shirt over trunk Pull over shirt/dress - Perfomed by helper: Thread/unthread right sleeve, Thread/unthread left sleeve Button up shirt - Perfomed by patient: Pull shirt around back, Button/unbutton shirt Button up shirt - Perfomed by helper: Thread/unthread right sleeve, Thread/unthread left sleeve    Upper body assist Assist Level: (mod A)      Lower Body Dressing/Undressing Lower body dressing   What is the patient wearing?: Pants, Underwear, Non-skid slipper socks Underwear - Performed by patient: Thread/unthread right underwear leg, Thread/unthread left underwear leg Underwear - Performed by helper: Pull underwear up/down Pants- Performed by patient: Thread/unthread right pants leg, Thread/unthread left pants leg Pants- Performed by helper: Pull pants up/down Non-skid slipper socks- Performed by patient: Don/doff right sock, Don/doff left sock                    Lower body assist Assist for lower body dressing: (mod A)      Toileting Toileting   Toileting  steps completed by patient: Adjust clothing prior to toileting, Performs perineal hygiene, Adjust clothing after toileting Toileting steps completed by helper: Adjust clothing prior to toileting, Performs perineal hygiene, Adjust clothing after toileting Toileting Assistive Devices: Grab bar  or rail  Toileting assist Assist level: Touching or steadying assistance (Pt.75%)   Transfers Chair/bed transfer   Chair/bed transfer method: Stand pivot Chair/bed transfer assist level: Moderate assist (Pt 50 - 74%/lift or lower) Chair/bed transfer assistive device: Armrests     Locomotion Ambulation     Max distance: 80 Assist level: Moderate assist (Pt 50 - 74%)   Wheelchair   Type: Manual Max wheelchair distance: 100 Assist Level: Total assistance (Pt < 25%)  Cognition Comprehension Comprehension assist level: Understands basic 25 - 49% of the time/ requires cueing 50 - 75% of the time  Expression Expression assist level: Expresses basic 25 - 49% of the time/requires cueing 50 - 75% of the time. Uses single words/gestures.  Social Interaction Social Interaction assist level: Interacts appropriately 25 - 49% of time - Needs frequent redirection.  Problem Solving Problem solving assist level: Solves basic 25 - 49% of the time - needs direction more than half the time to initiate, plan or complete simple activities  Memory Memory assist level: Recognizes or recalls less than 25% of the time/requires cueing greater than 75% of the time   Medical Problem List and Plan: 1.Functional and cognitive deficitssecondary to TBI -continue therapies -RLAS V 2. DVT Prophylaxis/Anticoagulation: Mechanical:Sequential compression devices, below kneeBilateral lower extremities 3.Headaches/Pain Management:tylenol prn 4. Mood:currently has no awareness of accident or deficits. LCSW to follow for now and complete evaluation when appropriate 5. Neuropsych: This patientis notcapable of making decisions on herown behalf. -working to re-establish sleep-wake cycle    -tolerated seroquel well last night. Appears to have helped -continue     -sleep chart    -Add low-dose methylphenidate for attention and arousal during the  day -continue enclosure bed for patient safety  6. Skin/Wound Care:routine pressure relief measures. 7. Fluids/Electrolytes/Nutrition:[place] po intake   -labs reviewed and wnl 8. ABLA: resolving.   -hgb up to 13.9 today 10. Optho: mydriasis right pupil, left CN III palsy   -consvt care, patching recommended by optho   -outpt neuro-eye follow up at discharge depending upon recovery     LOS (Days) 3 A Lac[place] T, MD 07/06/2017 9:44 AM

## 2017-07-07 ENCOUNTER — Inpatient Hospital Stay (HOSPITAL_COMMUNITY): Payer: Medicaid Other | Admitting: Speech Pathology

## 2017-07-07 ENCOUNTER — Inpatient Hospital Stay (HOSPITAL_COMMUNITY): Payer: Medicaid Other | Admitting: Physical Therapy

## 2017-07-07 ENCOUNTER — Inpatient Hospital Stay (HOSPITAL_COMMUNITY): Payer: Medicaid Other | Admitting: Occupational Therapy

## 2017-07-07 NOTE — Plan of Care (Signed)
  Progressing RH SAFETY RH STG ADHERE TO SAFETY PRECAUTIONS W/ASSISTANCE/DEVICE Description STG Adhere to Safety Precautions With Assistance/Device. 07/07/2017 1704 - Progressing by Ginette Pitman, RN RH STG DECREASED RISK OF FALL WITH ASSISTANCE Description STG Decreased Risk of Fall With Assistance. 07/07/2017 1704 - Progressing by Ginette Pitman, RN RH OTHER STG SAFETY GOALS W/ASSIST Description Other STG Safety Goals With Assistance. 07/07/2017 1704 - Progressing by Ginette Pitman, RN

## 2017-07-07 NOTE — Progress Notes (Signed)
  [facility] PHYSICAL MEDICINE & REHABILITATION     PROGRESS NOTE    Subjective/Complaints: No specific complaints Eager to eat breakfast  She was able to loosen lap belt and started walking on her own  Objective: Vital Signs: Blood pressure 108/65, pulse 60, temperature 97.8 F (36.6 C), temperature source Oral, resp. rate 17, height 5\' 7"  (1.702 m), weight 146 lb 9.7 oz (66.5 kg), SpO2 99 %.   Physical Exam:  nad Chest- cta cv- reg rate abd- soft, nt Ext- no edema Neuro- talkative . Speech pressured at times    Assessment/Plan: 1. Functional and cognitive deficits secondary to traumatic brain injury         Medical Problem List and Plan: 1.Functional and cognitive deficitssecondary to TBI -continue therapies -RLAS V 2. DVT Prophylaxis/Anticoagulation: Mechanical:Sequential compression devices, below kneeBilateral lower extremities 3.Headaches/Pain Management:tylenol prn 4. Mood:currently has no awareness of accident or deficits. LCSW to follow for now and complete evaluation when appropriate 5. Neuropsych: This patientis notcapable of making decisions on herown behalf. - -continue enclosure bed for patient safety  6. Skin/Wound Care:routine pressure relief measures. 7. Fluids/Electrolytes/Nutrition Basic Metabolic Panel:    Component Value Date/Time   NA 137 07/04/2017 0751   K 4.2 07/04/2017 0751   CL 100 (L) 07/04/2017 0751   CO2 30 07/04/2017 0751   BUN 8 07/04/2017 0751   CREATININE 0.78 07/04/2017 0751   GLUCOSE 90 07/04/2017 0751   CALCIUM 9.3 07/04/2017 0751    8. ABLA: resolving.    CBC:    Component Value Date/Time   WBC 10.2 07/04/2017 0751   HGB 13.9 07/04/2017 0751   HCT 41.8 07/04/2017 0751   PLT 375 07/04/2017 0751   MCV 90.5 07/04/2017 0751   NEUTROABS 7.0 07/04/2017 0751   LYMPHSABS 2.1 07/04/2017 0751   MONOABS 0.9 07/04/2017 0751   EOSABS 0.2 07/04/2017 0751   BASOSABS 0.0 07/04/2017 0751     10. Optho: mydriasis right pupil, left CN III palsy   -consvt care, patching recommended by optho   -outpt neuro-eye follow up at discharge depending upon recovery     LOS (Days) 4 A FACE TO Boyds, MD 07/07/2017 9:42 AM
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
## 2017-07-07 NOTE — Progress Notes (Signed)
Physical Therapy Session Note  Patient Details  Name: Ebony Jones MRN: [id number] Date of Birth: 02-06-1971  Today's Date: 07/07/2017 PT Individual Time: 1600-1700 PT Individual Time Calculation (min): 60 min   Short Term Goals: Week 1:  PT Short Term Goal 1 (Week 1): pt will transfer bed>< w/c with mod assist 3/4 trials PT Short Term Goal 2 (Week 1): pt will tolerate standing x 2 minutes during familiar functional activity PT Short Term Goal 3 (Week 1): pt will perform gait x 50' with min/mod assist PT Short Term Goal 4 (Week 1): pt will ascend/descend 4 steps 2 rails, mod assist  Skilled Therapeutic Interventions/Progress Updates:   Pt received supine in bed and agreeable to PT. Supine>sit transfer with supervision assist for safety   Gait throughout rehab unit with min assist overall with mod assist intermittently around turns.   Problem solving task to play checkers. Pt internally distracted with nonsensical  Conversation.  Scavenger hurt to find various items in rehab gym to force visual scanning. Pt able to retain target item 50% of the time.   Sorting task with bean bags. Required increased cues, progressing from questioning to instructional cue as time progressed and pt's attention to task reduced   Sustained attention task to pedal nustep x 5 min with only 2 cues from PT to remember target time.   Pt returned to room and performed ambulatory transfer to bed with min assist. Sit>supine completed with supervision assist, and pt left supine in posey veil bed with call bell in reach and all needs met.       Therapy Documentation Precautions:  Precautions Precautions: Fall Precaution Comments: TBI; visual deficits, very impulsive Restrictions Weight Bearing Restrictions: No Pain: none.   See Function Navigator for Current Functional Status.   Therapy/Group: Individual Therapy  Lorie Phenix 07/07/2017, 5:19 PM

## 2017-07-07 NOTE — Progress Notes (Signed)
Occupational Therapy Session Note  Patient Details  Name: Akaila Rambo MRN: [id number] Date of Birth: 01-Mar-1971  Today's Date: 07/07/2017 OT Individual Time: 1540-0867 and 6195-0932 OT Individual Time Calculation (min): 56 min and 43 min  Short Term Goals: Week 1:  OT Short Term Goal 1 (Week 1): Pt will tranfser to and from toilet with mi nA  OT Short Term Goal 2 (Week 1): Pt will don LB clothing with min A  OT Short Term Goal 3 (Week 1): Pt will locate items to her right using compensatory strageties with mod cuing  OT Short Term Goal 4 (Week 1): Pt will be oriented x4 with mod environmental cues  Skilled Therapeutic Interventions/Progress Updates:    Tx focus on ADL retraining, safety awareness, visual scanning, balance, attention, and activity tolerance.   Pt greeted in enclosure bed. Agreeable to shower. She ambulated into bathroom and voided bladder with Mod HHA and multimodal cues for safely navigating to toilet. She doffed clothing items, and then proceeded into shower. Pt bathing at sit<stand level with cues for sequencing, attention, and self organization. She dressed on toilet afterwards, with mod questioning/instructional cues for orientation of clothing items. Pt is also on her menstrual cycle and required Max A for feminine hygiene. She then ambulated to sink. She brushed teeth while seated, visually scanning to Rt for necessary items with cues. Step by step instructions for sequencing required. Pt returned to secure enclosure bed shortly until OT retrieved hair dryer. Pt able to maintain sustained attention to blow drying hair for 3 minute intervals while seated at sink. OT assisted with blow drying hair fully, and for detangling hair. Pt then reported feeling very fatigued. She ambulated back to enclosure bed. Provided her with clean linen, and secured enclosure bed with call bell accessible to her. At this time, family arrived. Educated them on limiting number of visitors to  minimize stimulation. Also educated them that enclosure bed can only be opened by staff. They verbalized understanding. Pt left with family at session exit.   2nd Session 1:1 tx (43 min) Tx focus on functional ambulation without device, balance, awareness, self organization, and orientation during functional tasks.   Pt greeted in w/c with mother and father present. After they left, pt was agreeable to make coffee. With instruction to wash hands at sink, pt ambulated into bathroom with Mod A HHA and attempting to wash hands in toilet. She left the room post handwashing at sink, and required max multimodal cues for locating room we had been to yesterday. Pt sitting in couch and able to read labels on vending machines with 75% accuracy. Also able to distinguish colors with 90% accuracy. Pt preparing coffee in standing with step by step cues for sequencing and self organizing. Pt reported being unable to see what she was looking for and required max A for visual demands of task. She ambulated into dayroom and drank coffee while sitting in front of Christmas tree. Played Christmas music and had her name familiar songs. Able to recall 3/5. Remembered lyrics to all of them. Discussed current season and traditions she and her family engaged in during the holidays. Pt repeating vague statements and unable to provide detailed recolections, but appeared cheerful during this conversation . She then ambulated back to room, required directional cues and Mod A for recovery post LOB to Rt. Pt was safely secured in enclosure bed with all needs within reach. Prior to exit, pt thanking OT for helping her today.   Therapy Documentation  Precautions:  Precautions Precautions: Fall Precaution Comments: TBI; visual deficits, very impulsive Restrictions Weight Bearing Restrictions: No Pain: Stomach pain, nausea, and some dizziness reported by pt during sessions. RN made aware.    ADL: ADL ADL Comments: see functional  naviagtor     See Function Navigator for Current Functional Status.   Therapy/Group: Individual Therapy  Althea Backs A Aiyannah Fayad 07/07/2017, 12:35 PM

## 2017-07-08 NOTE — Plan of Care (Signed)
  Progressing Consults RH BRAIN INJURY PATIENT EDUCATION Description Description: See Patient Education module for eduction specifics 07/08/2017 3838 - Progressing by Ginette Pitman, RN RH SAFETY RH STG DECREASED RISK OF FALL WITH ASSISTANCE Description STG Decreased Risk of Fall With Assistance. 07/08/2017 1353 - Progressing by Ginette Pitman, RN RH OTHER STG SAFETY GOALS W/ASSIST Description Other STG Safety Goals With Assistance. 07/08/2017 1353 - Progressing by Ginette Pitman, RN  Pt continues to requires rounding for toileting but has been remembering to ask. Unbalanced gait requires assistance when ambulating, enclosure bed continues. Pt still attempts to stand or exit bed and wheelchair or toilet without assistance
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
## 2017-07-09 ENCOUNTER — Inpatient Hospital Stay (HOSPITAL_COMMUNITY): Payer: Medicaid Other | Admitting: Physical Therapy

## 2017-07-09 ENCOUNTER — Inpatient Hospital Stay (HOSPITAL_COMMUNITY): Payer: Medicaid Other

## 2017-07-09 ENCOUNTER — Inpatient Hospital Stay (HOSPITAL_COMMUNITY): Payer: Medicaid Other | Admitting: Speech Pathology

## 2017-07-09 ENCOUNTER — Inpatient Hospital Stay (HOSPITAL_COMMUNITY): Payer: Medicaid Other | Admitting: Occupational Therapy

## 2017-07-09 NOTE — Progress Notes (Signed)
Speech Language Pathology Daily Session Note  Patient Details  Name: Ebony Jones MRN: [id number] Date of Birth: 09-Apr-1971  Today's Date: 07/09/2017 SLP Individual Time: 1305-1350 SLP Individual Time Calculation (min): 45 min  Short Term Goals: Week 1: SLP Short Term Goal 1 (Week 1): Patient will demonstrate sustained attention to task for ~5 minutes with Max A verbal cues for redirection.  SLP Short Term Goal 2 (Week 1): Patient will consume trials of regular textures with efficient mastication without overt s/s of aspiration over 2 sessions prior to upgrade.  SLP Short Term Goal 3 (Week 1): Patient will demonstrate functional problem solving for basic and familair tasks with Max A multimodal cues.  SLP Short Term Goal 4 (Week 1): Patient will verbalize wants/needs within a familair context with Max A multimodal cues.   Skilled Therapeutic Interventions: Skilled treatment session focused on cognitive goals. Upon arrival, patient was awake while in enclosure bed and asking to use the bathroom. SLP facilitated session by providing Mod A verbal and tactile cues for path finding and problem solving with basic self-care tasks. Patient was Mod I for set-up with a snack (opened yogurt and spoon) and consumed without overt s/s of aspiration. Patient initially required total A for orientation to place, time and  situation with visual aids initiated that patient was able to utilize with appropriate placement.  Patient able to recall orientation to place, time and situation after a 10 minute delay with Mod A verbal cues. Patient with decreased language of confusion with increased ability to maintain topic of conversation. Patient left supine in enclosure bed with all needs within reach. Continue with current plan of care.      Function:  Eating Eating   Modified Consistency Diet: Yes Eating Assist Level: More than reasonable amount of time;Supervision or verbal cues            Cognition Comprehension Comprehension assist level: Understands basic 25 - 49% of the time/ requires cueing 50 - 75% of the time  Expression   Expression assist level: Expresses basic 25 - 49% of the time/requires cueing 50 - 75% of the time. Uses single words/gestures.  Social Interaction Social Interaction assist level: Interacts appropriately 25 - 49% of time - Needs frequent redirection.  Problem Solving Problem solving assist level: Solves basic less than 25% of the time - needs direction nearly all the time or does not effectively solve problems and may need a restraint for safety  Memory Memory assist level: Recognizes or recalls less than 25% of the time/requires cueing greater than 75% of the time    Pain No/Denies Pain   Therapy/Group: Individual Therapy  Mckenzie Bove 07/09/2017, 2:23 PM
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
## 2017-07-09 NOTE — Progress Notes (Signed)
Physical Therapy Session Note  Patient Details  Name: Ebony Jones MRN: [id number] Date of Birth: [birth year]  Today's Date: 07/09/2017 PT Individual Time: 1410-1455 PT Individual Time Calculation (min): 45 min   Short Term Goals: Week 1:  PT Short Term Goal 1 (Week 1): pt will transfer bed>< w/c with mod assist 3/4 trials PT Short Term Goal 2 (Week 1): pt will tolerate standing x 2 minutes during familiar functional activity PT Short Term Goal 3 (Week 1): pt will perform gait x 50' with min/mod assist PT Short Term Goal 4 (Week 1): pt will ascend/descend 4 steps 2 rails, mod assist  Skilled Therapeutic Interventions/Progress Updates:    Patient in veil bed and roused with mod cues and increased time.  Patient S for supine <> sit and sit to stand S minguard bed to chair.  Patient in w/c requesting something to eat.  Patient propelled w/c 62' with feet with mod cues and S.  In dayroom pt ate magic cup with cues for bite size.  Patient ambulated in dayroom around obstacles with min cues and min A.  Identifying objects in room with pt closing one eye able to identify location of chairs and what a Christmas tree was, etc.  Patient performed standing balance activity on foam with min A to balance tape roll on tennis racket x 30 sec, and standing to toss bean bags on target on floor with minguard A.  Patient on Nu Step x 7 minutes with frequent cues for continuing activity and reminders about target time.  Level 4 UE and LE's for endurance and conditioning.  Patient ambulated to bathroom in her room min a and toileted with min A for balance/safety.  Patient settled in recliner with quick release belt, but too fatigued to sit at nurses station so assisted in veil bed and left with call button in reach.   Therapy Documentation Precautions:  Precautions Precautions: Fall Precaution Comments: TBI; visual deficits, very impulsive Restrictions Weight Bearing Restrictions: No Pain: Pain Assessment Pain  Assessment: No/denies pain   See Function Navigator for Current Functional Status.   Therapy/Group: Individual Therapy  Reginia Naas 07/09/2017, 4:18 PM

## 2017-07-09 NOTE — Progress Notes (Signed)
Physical Therapy Session Note  Patient Details  Name: Malayja Freund MRN: [id number] Date of Birth: 18-Nov-1970  Today's Date: 07/09/2017 PT Individual Time: 1030-1100 PT Individual Time Calculation (min): 30 min   Short Term Goals: Week 1:  PT Short Term Goal 1 (Week 1): pt will transfer bed>< w/c with mod assist 3/4 trials PT Short Term Goal 2 (Week 1): pt will tolerate standing x 2 minutes during familiar functional activity PT Short Term Goal 3 (Week 1): pt will perform gait x 50' with min/mod assist PT Short Term Goal 4 (Week 1): pt will ascend/descend 4 steps 2 rails, mod assist  Skilled Therapeutic Interventions/Progress Updates: Pt presented in bed with nsg present. Pt performed bed mobility with supervision and transferred to recliner with min gard. Pt initially lethargic but becoming more alert while receiving meds. Pt required frequent redirection as pt perserverated on "feeling off" and "feeling tired". Pt ambulated around unit HHA intially reaching for wall and objects and decreasing support with ambulation. Performed NuStep for sustained attention with PTA requiring frequent re-direction to sustain task and pt unable to recall duration requested(32mn) with pt performing x 4 min with x 3 breaks. Pt returned to room with pt scanning for room number and then  requesting to use BR. Performed toilet transfers with close S, hand hygiene done at sink with cues for reaching with RUE to reach for soap. Pt returned to enclosure bed and left with needs met.      Therapy Documentation Precautions:  Precautions Precautions: Fall Precaution Comments: TBI; visual deficits, very impulsive Restrictions Weight Bearing Restrictions: No General:   Vital Signs: Therapy Vitals Pulse Rate: 76 BP: 110/70  See Function Navigator for Current Functional Status.   Therapy/Group: Individual Therapy  Nikira Kushnir  Kyona Chauncey, PTA  07/09/2017, 12:09 PM

## 2017-07-09 NOTE — Progress Notes (Addendum)
Occupational Therapy Session Note  Patient Details  Name: Ebony Jones MRN: [id number] Date of Birth: 1971-06-07  Today's Date: 07/09/2017 OT Individual Time: 7001-7494 OT Individual Time Calculation (min): 70 min    Short Term Goals: Week 1:  OT Short Term Goal 1 (Week 1): Pt will tranfser to and from toilet with mi nA  OT Short Term Goal 2 (Week 1): Pt will don LB clothing with min A  OT Short Term Goal 3 (Week 1): Pt will locate items to her right using compensatory strageties with mod cuing  OT Short Term Goal 4 (Week 1): Pt will be oriented x4 with mod environmental cues  Skilled Therapeutic Interventions/Progress Updates:    Tx focus on balance, awareness, and ADL retraining.   Pt greeted in enclosure bed, reporting need to void. Pt ambulating with HHA into bathroom, Mod A intermittently due to impulsivity and lack of safety awareness. Able to pathfind her way without direction cues today. Pt sitting on toilet and voiding bladder. Blood spotting was not present as it was this weekend, and therefore feminine pad was not donned. She ambulated to TTB and bathed while sitting, at sit<stand level. Pt requiring max vcs to self organize, sequence, problem solve, and visually locate necessary items. She was however, able to read print on item labels. She dressed on toilet afterwards. Donned her bra inside out and pants on backwards, but pt unwilling to correct these errors with cuing. She then ambulated back to bed for rest break. Pt able to recall she was in a hospital in [place] with binary choice. Pt attending to hair brushing/blow drying task seated at sink for 1 minute intervals. Similar attention span when cleaning her room, including sweeping floor with broom and LH dustpan, and putting away clothing items ambulating with HHA. Pt requiring step by step instruction for cognitive/visual demands of cleaning tasks. She then ate breakfast with max cues for preparing her food with add-on items. Able to  attend to food in Lt/Rt visual fields however stating she was unable to see pancakes to spread butter. When asked to do so, pt trying to orally consume butter in container. She was also adamant her cottage cheese was orange juice and tried to drink it. At end of session, pt assisted OT with changing her bed linen. Pt able to orient pillowcase with min vcs to don it on pillow. Pt safely secured in enclosure bed with all needs at session exit.   Therapy Documentation Precautions:  Precautions Precautions: Fall Precaution Comments: TBI; visual deficits, very impulsive Restrictions Weight Bearing Restrictions: No General:   Vital Signs:  Pain: No c/o pain during tx    ADL: ADL ADL Comments: see functional naviagtor     See Function Navigator for Current Functional Status.   Therapy/Group: Individual Therapy  Edrie Ehrich A Shawneen Deetz 07/09/2017, 9:51 AM

## 2017-07-10 ENCOUNTER — Inpatient Hospital Stay (HOSPITAL_COMMUNITY): Payer: Medicaid Other | Admitting: Physical Therapy

## 2017-07-10 ENCOUNTER — Inpatient Hospital Stay (HOSPITAL_COMMUNITY): Payer: Medicaid Other

## 2017-07-10 ENCOUNTER — Inpatient Hospital Stay (HOSPITAL_COMMUNITY): Payer: Medicaid Other | Admitting: Occupational Therapy

## 2017-07-10 ENCOUNTER — Inpatient Hospital Stay (HOSPITAL_COMMUNITY): Payer: Medicaid Other | Admitting: Speech Pathology

## 2017-07-10 LAB — BASIC METABOLIC PANEL
Anion gap: 10 (ref 5–15)
BUN: 9 mg/dL (ref 6–20)
CO2: 26 mmol/L (ref 22–32)
Calcium: 9.1 mg/dL (ref 8.9–10.3)
Chloride: 100 mmol/L — ABNORMAL LOW (ref 101–111)
Creatinine, Ser: 0.57 mg/dL (ref 0.44–1.00)
GFR calc Af Amer: >60 mL/min (ref >60)
GFR calc non Af Amer: >60 mL/min (ref >60)
Glucose, Bld: 84 mg/dL (ref 65–99)
Potassium: 4 mmol/L (ref 3.5–5.1)
Sodium: 136 mmol/L (ref 135–145)

## 2017-07-10 MED ORDER — METHYLPHENIDATE HCL 5 MG PO TABS
10.0000 mg | ORAL_TABLET | Freq: Two times a day (BID) | ORAL | Status: DC
  Administered 2017-07-10 – 2017-07-14 (×8): 10 mg via ORAL
  Filled 2017-07-10 (×8): qty 2

## 2017-07-10 NOTE — Progress Notes (Signed)
[facility] PHYSICAL MEDICINE & REHABILITATION     PROGRESS NOTE    Subjective/Complaints: Just waking up.  Mild headache.  States "this is weird"  ROS: Limited due to cognitive/behavioral    Objective: Vital Signs: Blood pressure 100/60, pulse 60, temperature 98.9 F (37.2 C), temperature source Oral, resp. rate 19, height 5\' 7"  (1.702 m), weight 66.5 kg (146 lb 9.7 oz), SpO2 100 %. No results found. No results for input(s): WBC, HGB, HCT, PLT in the last 72 hours. Recent Labs    07/10/17 0552  NA 136  K 4.0  CL 100*  GLUCOSE 84  BUN 9  CREATININE 0.57  CALCIUM 9.1   CBG (last 3)  No results for input(s): GLUCAP in the last 72 hours.  Wt Readings from Last 3 Encounters:  07/03/17 66.5 kg (146 lb 9.7 oz)  07/03/17 66.5 kg (146 lb 9.7 oz)  03/28/17 68 kg (150 lb)    Physical Exam:  Constitutional: She appearswell-developedand well-nourished.No distress.in enclosure bed HENT:  Head:Normocephalicand atraumatic.  Eyes: Mydriasis right eye.  Neck:Normal range of motion.Neck supple.  Cardiovascular:RRR without murmur. No JVD .  Respiratory:CTA Bilaterally without wheezes or rales. Normal effort   NI:OEVO.Bowel sounds are normal. She exhibitsno distension. There isno tenderness.  Musculoskeletal: She exhibits noedemaor tenderness.  Neurological: Slow to arouse this morning.  Acranial nerve deficitis present.   dysconjugate gaze, left eye with decr medial movement--essentially unchanged. Follows simple commands.  Moves all 4 limbs.  Skin: Skin iswarmand dry.  Psychiatric: inattentive  , restless.     Assessment/Plan: 1. 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 Physiatrist is providing close team supervision and 24 hour management of active medical problems listed below. Physiatrist and rehab team continue to  assess barriers to discharge/monitor patient progress toward functional and medical goals.  Function:  Bathing Bathing position   Position: Shower  Bathing parts Body parts bathed by patient: Front perineal area, Buttocks, Right lower leg, Left lower leg, Right arm, Left arm, Chest, Abdomen, Right upper leg, Left upper leg Body parts bathed by helper: Back  Bathing assist Assist Level: Touching or steadying assistance(Pt > 75%)   Set up : To obtain items  Upper Body Dressing/Undressing Upper body dressing   What is the patient wearing?: Pull over shirt/dress, Bra Bra - Perfomed by patient: Thread/unthread right bra strap, Thread/unthread left bra strap, Hook/unhook bra (pull down sports bra)   Pull over shirt/dress - Perfomed by patient: Thread/unthread right sleeve, Thread/unthread left sleeve, Put head through opening, Pull shirt over trunk Pull over shirt/dress - Perfomed by helper: Thread/unthread right sleeve, Thread/unthread left sleeve Button up shirt - Perfomed by patient: Pull shirt around back, Button/unbutton shirt Button up shirt - Perfomed by helper: Thread/unthread right sleeve, Thread/unthread left sleeve    Upper body assist Assist Level: Supervision or verbal cues      Lower Body Dressing/Undressing Lower body dressing   What is the patient wearing?: Pants, Non-skid slipper socks, Underwear Underwear - Performed by patient: Thread/unthread right underwear leg, Thread/unthread left underwear leg, Pull underwear up/down Underwear - Performed by helper: Pull underwear up/down Pants- Performed by patient: Thread/unthread right pants leg, Thread/unthread left pants leg, Pull pants up/down Pants- Performed by helper: Pull pants up/down Non-skid slipper socks- Performed by patient: Don/doff right sock, Don/doff left sock  Lower body assist Assist for lower body dressing: Touching or steadying assistance (Pt > 75%)      Toileting Toileting    Toileting steps completed by patient: Adjust clothing prior to toileting, Performs perineal hygiene, Adjust clothing after toileting Toileting steps completed by helper: Adjust clothing after toileting Toileting Assistive Devices: Grab bar or rail  Toileting assist Assist level: Touching or steadying assistance (Pt.75%)   Transfers Chair/bed transfer   Chair/bed transfer method: Stand pivot Chair/bed transfer assist level: Touching or steadying assistance (Pt > 75%) Chair/bed transfer assistive device: Armrests     Locomotion Ambulation     Max distance: 120 Assist level: Touching or steadying assistance (Pt > 75%)   Wheelchair   Type: Manual Max wheelchair distance: 50 Assist Level: Touching or steadying assistance (Pt > 75%)  Cognition Comprehension Comprehension assist level: Understands basic 25 - 49% of the time/ requires cueing 50 - 75% of the time  Expression Expression assist level: Expresses basic 25 - 49% of the time/requires cueing 50 - 75% of the time. Uses single words/gestures.  Social Interaction Social Interaction assist level: Interacts appropriately 25 - 49% of time - Needs frequent redirection.  Problem Solving Problem solving assist level: Solves basic less than 25% of the time - needs direction nearly all the time or does not effectively solve problems and may need a restraint for safety  Memory Memory assist level: Recognizes or recalls less than 25% of the time/requires cueing greater than 75% of the time   Medical Problem List and Plan: 1.Functional and cognitive deficitssecondary to TBI -continue therapies -RLAS V perhaps V+ 2. DVT Prophylaxis/Anticoagulation: Mechanical:Sequential compression devices, below kneeBilateral lower extremities 3.Headaches/Pain Management:tylenol prn 4. Mood:currently has no awareness of accident or deficits. LCSW to follow for now and complete evaluation when appropriate 5. Neuropsych: This  patientis notcapable of making decisions on herown behalf. -working to re-establish sleep-wake cycle    -continue HS seroquel w  -continue     -sleep chart    -Added low-dose methylphenidate for attention and arousal during the day--increase to 10 mg -continue enclosure bed for patient safety  6. Skin/Wound Care:routine pressure relief measures. 7. Fluids/Electrolytes/Nutrition:[place] po intake   -labs reviewed and wnl 8. ABLA: resolving.   -hgb up to 13.9   10. Optho: mydriasis right pupil, left CN III palsy   -consvt care, patching recommended by optho   -outpt neuro-eye follow up at discharge depending upon recovery   -Stable at present     LOS (Days) 7 A FACE TO [place] T, MD 07/10/2017 9:00 AM

## 2017-07-10 NOTE — Progress Notes (Signed)
Physical Therapy Session Note  Patient Details  Name: Ebony Jones MRN: [id number] Date of Birth: [birth year]  Today's Date: 07/10/2017 PT Individual Time: 1300-1325 PT Individual Time Calculation (min): 25 min   Short Term Goals: Week 1:  PT Short Term Goal 1 (Week 1): pt will transfer bed>< w/c with mod assist 3/4 trials PT Short Term Goal 2 (Week 1): pt will tolerate standing x 2 minutes during familiar functional activity PT Short Term Goal 3 (Week 1): pt will perform gait x 50' with min/mod assist PT Short Term Goal 4 (Week 1): pt will ascend/descend 4 steps 2 rails, mod assist  Skilled Therapeutic Interventions/Progress Updates:    Pt seated in recliner upon PT arrival, agreeable to therapy tx however reporting she is very sleepy. Pt denies pain. Eye patched used during this session to cover L eye for forced use of R eye. Pt only keeping eye patch in place for about 30 sec- 1 min during ambulation and then reporting " I just feel weird." Pt perseverating this session on going home and "tell my husband to get me in an hour or I'm not waiting." Pt performed sit<>stands with min assist and ambulated from room>dayroom x 80 ft with min assist. While taking seated rest break pt encouraged to keep eye patch over L eye and scan to locate items in the dayroom, requiring max cues to keep eye patch in place. Pt ambulated back to room from dayroom with min assist, pt able to locate her room with min verbal cues. Pt left seated in recliner at end of session and QRB in place.   Therapy Documentation Precautions:  Precautions Precautions: Fall Precaution Comments: TBI; visual deficits, very impulsive Restrictions Weight Bearing Restrictions: No   See Function Navigator for Current Functional Status.   Therapy/Group: Individual Therapy  Netta Corrigan, PT, DPT 07/10/2017, 7:49 AM

## 2017-07-10 NOTE — Progress Notes (Signed)
Speech Language Pathology Daily Session Note  Patient Details  Name: Ebony Jones MRN: [id number] Date of Birth: February 22, 1971  Today's Date: 07/10/2017 SLP Individual Time: 0830-0930 SLP Individual Time Calculation (min): 60 min  Short Term Goals: Week 1: SLP Short Term Goal 1 (Week 1): Patient will demonstrate sustained attention to task for ~5 minutes with Max A verbal cues for redirection.  SLP Short Term Goal 2 (Week 1): Patient will consume trials of regular textures with efficient mastication without overt s/s of aspiration over 2 sessions prior to upgrade.  SLP Short Term Goal 3 (Week 1): Patient will demonstrate functional problem solving for basic and familair tasks with Max A multimodal cues.  SLP Short Term Goal 4 (Week 1): Patient will verbalize wants/needs within a familair context with Max A multimodal cues.   Skilled Therapeutic Interventions: Skilled treatment session focused on cognitive and dysphagia goals. SLP facilitated session by providing trials of regular textures. Patient demonstrated efficient mastication without overt s/s of aspiration and complete oral clearance, therefore, recommend patient upgrade to regular textures. SLP also facilitated session by providing external aids for recall of date, time and orientation in which she could recall after a 10 minute delay with Mod A verbal cues. Patient perseverative on going home with language of confusion but was easily redirected. Patient also sorted colors from a field of 2 with extra time and Min A verbal cues. Patient left in enclosure bed with all needs within reach. Continue with current plan of care.      Function:  Eating Eating   Modified Consistency Diet: Yes Eating Assist Level: More than reasonable amount of time;Supervision or verbal cues           Cognition Comprehension Comprehension assist level: Understands basic 50 - 74% of the time/ requires cueing 25 - 49% of the time  Expression   Expression  assist level: Expresses basic 50 - 74% of the time/requires cueing 25 - 49% of the time. Needs to repeat parts of sentences.  Social Interaction Social Interaction assist level: Interacts appropriately 50 - 74% of the time - May be physically or verbally inappropriate.  Problem Solving Problem solving assist level: Solves basic less than 25% of the time - needs direction nearly all the time or does not effectively solve problems and may need a restraint for safety  Memory Memory assist level: Recognizes or recalls less than 25% of the time/requires cueing greater than 75% of the time    Pain Pain Assessment Faces Pain Scale: Hurts a little bit  Therapy/Group: Individual Therapy  Azayla Polo 07/10/2017, 3:03 PM

## 2017-07-10 NOTE — Progress Notes (Signed)
Occupational Therapy Session Note  Patient Details  Name: Ebony Jones MRN: [id number] Date of Birth: 08/09/1970  Today's Date: 07/10/2017 OT Individual Time: 0945-1100 OT Individual Time Calculation (min): 75 min    Short Term Goals: Week 1:  OT Short Term Goal 1 (Week 1): Pt will tranfser to and from toilet with mi nA  OT Short Term Goal 2 (Week 1): Pt will don LB clothing with min A  OT Short Term Goal 3 (Week 1): Pt will locate items to her right using compensatory strageties with mod cuing  OT Short Term Goal 4 (Week 1): Pt will be oriented x4 with mod environmental cues  Skilled Therapeutic Interventions/Progress Updates:    Upon entering the room, pt seated in recliner chair awaiting therapist. Pt requesting to go outside this session. Pt transferred from chair >wheelchair with min A for balance. OT assisted pt via wheelchair outside secondary to time management. Pt required max A for orientation to location, situation, and time. 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 Pt returning to room in same manner as above. Pt unable to path find this session. Pt eating ice cream with min cues for small bites and sipping soda with supervision for safety. Pt verbalized need for bathroom and impulsively standing and ambulating to bathroom with min A for balance. Pt required steady assistance for balance with clothing management and hygiene. Pt returning to enclosure bed at end of session and secured.   Therapy Documentation Precautions:  Precautions Precautions: Fall Precaution Comments: TBI; visual deficits, very impulsive Restrictions Weight Bearing Restrictions: No General:   Vital Signs: Therapy Vitals Temp: (!) 97.5 F (36.4 C) Temp Source: Oral Pulse Rate: 80 Resp: 19 BP: 130/81 Patient Position (if appropriate):  Sitting Oxygen Therapy SpO2: 100 % O2 Device: Not Delivered Pain:   ADL: ADL ADL Comments: see functional naviagtor Vision   Perception    Praxis   Exercises:   Other Treatments:    See Function Navigator for Current Functional Status.   Therapy/Group: Individual Therapy  Gypsy Decant 07/10/2017, 4:39 PM

## 2017-07-10 NOTE — Progress Notes (Signed)
Physical Therapy Session Note  Patient Details  Name: Ebony Jones MRN: [id number] Date of Birth: 1971/04/22  Today's Date: 07/10/2017 PT Individual Time: 1415-1455 PT Individual Time Calculation (min): 40 min   Short Term Goals: Week 1:  PT Short Term Goal 1 (Week 1): pt will transfer bed>< w/c with mod assist 3/4 trials PT Short Term Goal 2 (Week 1): pt will tolerate standing x 2 minutes during familiar functional activity PT Short Term Goal 3 (Week 1): pt will perform gait x 50' with min/mod assist PT Short Term Goal 4 (Week 1): pt will ascend/descend 4 steps 2 rails, mod assist  Skilled Therapeutic Interventions/Progress Updates:   Pt in enclosure bed upon arrival and agreeable to therapy, no c/o pain. Pt perseverating throughout session about going home today stating, "I have things to do" and "I need to get back to my kids". Required frequent redirection and max cues to attend to task. Additionally reports "I feel off" and "my balance is messed up". Reoriented pt multiple times during session, however unable to remain oriented. Ambulated around unit in 100-150' bouts w/ Min assist for balance. Seated rest breaks 2/2 fatigue and eating snack. Worked on functional mobility w/ gait, deciding on what to eat and eating snack, and making coffee. Frequent verbal and visual cues for engaging in task 2/2 cognition and impaired vision. Returned to room and ended session in enclosure bed, call bell within reach and all needs met. Pt resting quietly and quickly fell asleep.   Therapy Documentation Precautions:  Precautions Precautions: Fall Precaution Comments: TBI; visual deficits, very impulsive Restrictions Weight Bearing Restrictions: No Vital Signs: Therapy Vitals Temp: (!) 97.5 F (36.4 C) Temp Source: Oral Pulse Rate: 80 Resp: 19 BP: 130/81 Patient Position (if appropriate): Sitting Oxygen Therapy SpO2: 100 % O2 Device: Not Delivered  See Function Navigator for Current  Functional Status.   Therapy/Group: Individual Therapy  Aliani Caccavale K Arnette 07/10/2017, 5:04 PM

## 2017-07-10 NOTE — Progress Notes (Signed)
Recreational Therapy Session Note  Patient Details  Name: Ebony Jones MRN: [id number] Date of Birth: 10/24/1970 Today's Date: 07/10/2017   Eval deferred.  Pt not yet appropriate for TR services.  Will continue to monitor through team for future participation. [place] 07/10/2017, 10:30 AM

## 2017-07-10 NOTE — Progress Notes (Signed)
Social Work Patient ID: Colletta [place], female   DOB: 1971/03/07, 46 y.o.   MRN: [id number]   Lengthy discussion with pt's mother yesterday afternoon to discuss d/c planning issues.  (Mother is agreeable that any issues related to patient case can  also be discussed with pt's boyfriend, Bearl Mulberry).   Discussed anticipated need for 24/7 care upon d/c and mother understands this.  I explained that, per my discussion with boyfriend last week, he had expressed concerns about being able to provide this level of care if there any significant behavioral issues as he is disabled himself and has his 33 yo son living with him every other week.  Questioned level of care she and other family might provide if Mr. Alroy Dust does not agree to pt d/c home with him.  I stressed to her that family will be looked to to provide care if boyfriend cannot.  Mother becomes anxious with suggestion that family would be expected to provide care.  She reports that her spouse has had prior CVAs and that she also works and states she cannot provide 24/7 care.  She also states that pt's past behaviors due to SA issues have placed a strain on all familial relationships pt has with parents, siblings and pt's own children.  Mother and pt's  boyfriend have asked about option of placement in a "nursing home or group home."  Have attempted to limit discussion of this option and to continue to stress to boyfriend and family to consider any way they might be able to care for pt at home.  In regards to option of placement, however, I will discuss with MD and financial counseling about possibility of SSD app appropriateness.  Pt currently has only MA-F coverage which would not cover RH or SNF.  I am also concerned with pt's known SA issues as being a barrier to any placement in addition to potential behavioral issues.    Plan to meet with Rehab Director and Admissions Coordinator today if possible to review case.  Will continue to  follow.  Anie Juniel, LCSW

## 2017-07-11 ENCOUNTER — Inpatient Hospital Stay (HOSPITAL_COMMUNITY): Payer: Medicaid Other | Admitting: Occupational Therapy

## 2017-07-11 ENCOUNTER — Inpatient Hospital Stay (HOSPITAL_COMMUNITY): Payer: Medicaid Other

## 2017-07-11 MED ORDER — PROPRANOLOL HCL 10 MG PO TABS
10.0000 mg | ORAL_TABLET | Freq: Three times a day (TID) | ORAL | Status: DC
  Administered 2017-07-11 – 2017-07-18 (×19): 10 mg via ORAL
  Filled 2017-07-11 (×21): qty 1
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
## 2017-07-11 NOTE — Progress Notes (Signed)
Speech Language Pathology Weekly Progress and Session Note  Patient Details  Name: Ebony Jones MRN: [id number] Date of Birth: 12/22/1970  Beginning of progress report period: July 03, 2017 End of progress report period: July 11, 2017  Today's Date: 07/11/2017 SLP Individual Time: 0900-0930 SLP Individual Time Calculation (min): 30 min  Short Term Goals: Week 1: SLP Short Term Goal 1 (Week 1): Patient will demonstrate sustained attention to task for ~5 minutes with Max A verbal cues for redirection.  SLP Short Term Goal 1 - Progress (Week 1): Met SLP Short Term Goal 2 (Week 1): Patient will consume trials of regular textures with efficient mastication without overt s/s of aspiration over 2 sessions prior to upgrade.  SLP Short Term Goal 2 - Progress (Week 1): Met SLP Short Term Goal 3 (Week 1): Patient will demonstrate functional problem solving for basic and familair tasks with Max A multimodal cues.  SLP Short Term Goal 3 - Progress (Week 1): Met SLP Short Term Goal 4 (Week 1): Patient will verbalize wants/needs within a familair context with Max A multimodal cues.  SLP Short Term Goal 4 - Progress (Week 1): Met    New Short Term Goals: Week 2: SLP Short Term Goal 1 (Week 2): Patient will demonstrate sustained attention to task for ~5 minutes with Mod A verbal cues for redirection.  SLP Short Term Goal 2 (Week 2): Patient will tolerate regular texture diet with efficient mastication without overt s/s of aspiration and supervision cues for swallow safety.  SLP Short Term Goal 3 (Week 2): Patient will demonstrate functional problem solving for basic and familair tasks with Mod A multimodal cues.  SLP Short Term Goal 4 (Week 2): Patient will verbalize wants/needs within a familair context with Mod A multimodal cues.  SLP Short Term Goal 5 (Week 2): Patient will utilize external memory aids to recall basic, daily information with Mod A verbal and question cues.  Weekly Progress  Updates: Pt demonstrated ability to met 4 out 4 short term goals, requiring max assistance. Pt continues to require skilled ST services focusing on increasing sustained attention, basic problem solving, orientation, recall and expressing wants/needs. Pt' diet has been upgraded to regular textured foods with continues full supervision due to cognitive impairment. Pt continues to benefit from skilled ST services in order to maximize functional independence and reduce burden of care.     Intensity: Minumum of 1-2 x/day, 30 to 90 minutes Frequency: 1 to 3 out of 7 days Duration/Length of Stay: 21-24 days  Treatment/Interventions: Cognitive remediation/compensation;Environmental controls;Internal/external aids;Speech/Language facilitation;Cueing hierarchy;Dysphagia/aspiration precaution training;Functional tasks;Patient/family education;Therapeutic Activities   Daily Session  Skilled Therapeutic Interventions: Skilled ST services focused on cognitive skills. SLP facilitated basic problem solving skills utilizing simple picture cards to identify "what is wrong?" , pt required mod-min A verbal cues in problem solving and max A verbal cues to sustain attention during task. Pt demonstrated orientation utilizing visual aid and required max a verbal cues to utilize recall visual aid.Pt demonstrated language of confusion, preseverated on wanting to go home, however was easily redirected. Pt was left behind nurse station with safety belt on. Recommend to continue skilled services.    Function:   Eating Eating                 Cognition Comprehension Comprehension assist level: Understands basic 25 - 49% of the time/ requires cueing 50 - 75% of the time  Expression   Expression assist level: Expresses basic 25 - 49% of the time/requires  cueing 50 - 75% of the time. Uses single words/gestures.  Social Interaction Social Interaction assist level: Interacts appropriately 25 - 49% of time - Needs frequent  redirection.  Problem Solving Problem solving assist level: Solves basic less than 25% of the time - needs direction nearly all the time or does not effectively solve problems and may need a restraint for safety  Memory Memory assist level: Recognizes or recalls less than 25% of the time/requires cueing greater than 75% of the time   General    Pain Pain Assessment Pain Assessment: No/denies pain  Therapy/Group: Individual Therapy  Regis Wiland  [facility] 07/11/2017, 3:36 PM

## 2017-07-11 NOTE — Progress Notes (Signed)
Occupational Therapy Session Note  Patient Details  Name: Ebony Jones MRN: [id number] Date of Birth: Feb 14, 1971  Today's Date: 07/11/2017 OT Individual Time: 1191-4782 OT Individual Time Calculation (min): 59 min    Short Term Goals: Week 1:  OT Short Term Goal 1 (Week 1): Pt will tranfser to and from toilet with mi nA  OT Short Term Goal 2 (Week 1): Pt will don LB clothing with min A  OT Short Term Goal 3 (Week 1): Pt will locate items to her right using compensatory strageties with mod cuing  OT Short Term Goal 4 (Week 1): Pt will be oriented x4 with mod environmental cues  Skilled Therapeutic Interventions/Progress Updates:    Session 1: Pt received sitting up in w/c, reporting feeling fatigued and initially requesting to return to bed, with encouragement Pt willing and engaging in OT therapy session. Pt reporting/acknowleding decreased vision. Worked with Pt to read and scan written material in Pt's room with Pt having to turn head extensively to the L to read items correctly. Pt consistently requires redirection to tasks throughout session. Engaged Pt in bed making activity with Min steady assist provided for steadying while Pt dons fitted sheet to mattress; Pt successfully makes 50% of bed, increased difficulty for remaining portions due to visual deficits with Pt initiating stopping activity, reporting fatigue and needing to rest. After rest and with encouragement, Pt agreeable to washing hair/shower; completes transfer to TTB in shower with MinA, requires max encouragement to doff clothing prior to showering as Pt reporting wishes to wash hair with clothing on in shower. Pt washes upper body with close supervision. Dons UB/LB clothing with Min steadying assist during standing portions. Pt returned to enclosure bed end of session with needs within reach, resting comfortably.   Session 2: Pt received sitting at nursing station, agreeable to OT treatment session. Pt requesting coffee,  propelled w/c to family room and assisted Pt in making coffee with MinA in standing and mod verbal cues for visual tracking/sequencing activity. Pt reporting need to toilet, ambulated from family room to Pt bathroom with MinA and min verbal cues for avoiding obstacles. Pt completes toileting with overall supervision and returns to family room in manner described above. Transported Pt to rehab apartment where Pt engaged in dynamic standing activity washing kitchen counter and making the bed (top blanket only). Pt completed each task with close MinGuard-MinA for dynamic balance and verbal cues for attending to task/visual compensation, seated rest break throughout. Transported Pt to nursing station where Pt was left seated under nursing supervision, QRB donned.   Therapy Documentation Precautions:  Precautions Precautions: Fall Precaution Comments: TBI; visual deficits, very impulsive Restrictions Weight Bearing Restrictions: No    Pain: Pain Assessment Pain Assessment: No/denies pain ADL: ADL ADL Comments: see functional naviagtor  See Function Navigator for Current Functional Status.   Therapy/Group: Individual Therapy  Raymondo Band 07/11/2017, 12:24 PM

## 2017-07-11 NOTE — Progress Notes (Signed)
Occupational Therapy Session Note  Patient Details  Name: Ebony Jones MRN: [id number] Date of Birth: 11/08/1970  Today's Date: 07/11/2017 OT Individual Time: 0700-0740 OT Individual Time Calculation (min): 40 min  Missed 20 minutes secondary to fatigue  Short Term Goals: Week 1:  OT Short Term Goal 1 (Week 1): Pt will tranfser to and from toilet with mi nA  OT Short Term Goal 2 (Week 1): Pt will don LB clothing with min A  OT Short Term Goal 3 (Week 1): Pt will locate items to her right using compensatory strageties with mod cuing  OT Short Term Goal 4 (Week 1): Pt will be oriented x4 with mod environmental cues  Skilled Therapeutic Interventions/Progress Updates:    Upon entering the room, pt supine in bed sleeping soundly. Pt taking several minutes to wake up and required coaxing out of bed. Pt performed supine >sit with supervision and ambulating to bathroom with min HHA for safety and verbal cues for directions. Pt refused shower and toileting once inside of bathroom. Pt stating, " I need coffee. It's too early!" Pt ambulating in same manner 10' into family room to make coffee. Pt seated and drinking beverage before ambulating back to room while holding onto coffee cup. Pt returning to bed while therapist obtained breakfast tray. Pt seated on EOB for breakfast with set up A to open containers and cut food. Pt required visual cues and hand over hand assistance to locate some foods on tray table. Pt required min cues for small bites this session. Pt yelling, " Leave me alone! It is too early for this and I'm going to bed!" Pt returning to supine and therapist unable to redirect pt at this time. Enclosure bed secure and pt resting upon exiting the room.   Therapy Documentation Precautions:  Precautions Precautions: Fall Precaution Comments: TBI; visual deficits, very impulsive Restrictions Weight Bearing Restrictions: No General: General OT Amount of Missed Time: 20 Minutes Vital  Signs: Therapy Vitals Temp: 98 F (36.7 C) Temp Source: Oral Pulse Rate: 65 Resp: 18 BP: 108/70 Patient Position (if appropriate): Lying Oxygen Therapy SpO2: 98 % O2 Device: Not Delivered Pain:   ADL: ADL ADL Comments: see functional naviagtor Vision   Perception    Praxis   Exercises:   Other Treatments:    See Function Navigator for Current Functional Status.   Therapy/Group: Individual Therapy  Gypsy Decant 07/11/2017, 7:55 AM

## 2017-07-12 ENCOUNTER — Inpatient Hospital Stay (HOSPITAL_COMMUNITY): Payer: Medicaid Other | Admitting: Speech Pathology

## 2017-07-12 ENCOUNTER — Inpatient Hospital Stay (HOSPITAL_COMMUNITY): Payer: Medicaid Other | Admitting: Physical Therapy

## 2017-07-12 ENCOUNTER — Inpatient Hospital Stay (HOSPITAL_COMMUNITY): Payer: Medicaid Other

## 2017-07-12 ENCOUNTER — Inpatient Hospital Stay (HOSPITAL_COMMUNITY): Payer: Medicaid Other | Admitting: Occupational Therapy

## 2017-07-12 NOTE — Progress Notes (Signed)
Speech Language Pathology Daily Session Note  Patient Details  Name: Ebony Jones MRN: [id number] Date of Birth: 05-05-1971  Today's Date: 07/12/2017 SLP Individual Time: 1300-1330 SLP Individual Time Calculation (min): 30 min  Short Term Goals: Week 2: SLP Short Term Goal 1 (Week 2): Patient will demonstrate sustained attention to task for ~5 minutes with Mod A verbal cues for redirection.  SLP Short Term Goal 2 (Week 2): Patient will tolerate regular texture diet with efficient mastication without overt s/s of aspiration and supervision cues for swallow safety.  SLP Short Term Goal 3 (Week 2): Patient will demonstrate functional problem solving for basic and familair tasks with Mod A multimodal cues.  SLP Short Term Goal 4 (Week 2): Patient will verbalize wants/needs within a familair context with Mod A multimodal cues.  SLP Short Term Goal 5 (Week 2): Patient will utilize external memory aids to recall basic, daily information with Mod A verbal and question cues.  Skilled Therapeutic Interventions: Skilled treatment session focused on cognition goals. SLP facilitated session by providing Total A for redirection to task d/t continual perseveration of needing a cigarette/drink. Pt's attention to task was less than 1 minute. Pt able to verbalize requests/needs but as SLP attempted to met them, pt forgot she had requested and then refused. Pt left upright in recliner withsafety belt donned and at nursing station for increased supervision.       Function:    Cognition Comprehension Comprehension assist level: Understands basic 25 - 49% of the time/ requires cueing 50 - 75% of the time  Expression   Expression assist level: Expresses basic 25 - 49% of the time/requires cueing 50 - 75% of the time. Uses single words/gestures.  Social Interaction Social Interaction assist level: Interacts appropriately 25 - 49% of time - Needs frequent redirection.  Problem Solving Problem solving assist level:  Solves basic less than 25% of the time - needs direction nearly all the time or does not effectively solve problems and may need a restraint for safety  Memory Memory assist level: Recognizes or recalls less than 25% of the time/requires cueing greater than 75% of the time    Pain    Therapy/Group: Individual Therapy  Lyndsey Demos 07/12/2017, 1:55 PM

## 2017-07-12 NOTE — Progress Notes (Signed)
Occupational Therapy Weekly Progress Note  Patient Details  Name: Ebony Jones MRN: [id number] Date of Birth: 1971-01-11  Beginning of progress report period: July 04, 2017 End of progress report period: July 12, 2017     Patient has met 2 of 4 short term goals. Pt is making steady progress towards OT goals this week. Pt is currently min A for self care and functional transfer without use of AD for ambulation. Pt requires max - total multimodal cues for cognition at this time. Pt with limited insight regarding current deficits and situation. Family has not been present for observation or education at this time. Pt will continue to benefit from skilled OT intervention to address current deficits.   Patient continues to demonstrate the following deficits: muscle weakness, decreased cardiorespiratoy endurance, field cut, decreased attention to right and decreased motor planning, decreased initiation, decreased attention, decreased awareness, decreased problem solving, decreased safety awareness and decreased memory and decreased sitting balance, decreased standing balance and decreased balance strategies and therefore will continue to benefit from skilled OT intervention to enhance overall performance with BADL.  Patient progressing toward long term goals..  Continue plan of care.  OT Short Term Goals Week 2:  OT Short Term Goal 1 (Week 2): Pt will locate items on R using compensatory strategies with mod multimodal cues.  OT Short Term Goal 2 (Week 2): Pt will be orientated x 4 with mod multimodal cues. OT Short Term Goal 3 (Week 2): Pt will perform clothing management and hygiene after toileting with close supervision for balance and min cues for safety awareness.       Therapy Documentation Precautions:  Precautions Precautions: Fall Precaution Comments: TBI; visual deficits, very impulsive Restrictions Weight Bearing Restrictions: No General: General PT Missed Treatment  Reason: Patient fatigue   Gypsy Decant 07/12/2017, 11:44 AM
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
## 2017-07-12 NOTE — Progress Notes (Signed)
Occupational Therapy Session Note  Patient Details  Name: Ebony Jones MRN: [id number] Date of Birth: 07-28-1971  Today's Date: 07/12/2017 OT Individual Time: 1000-1045 OT Individual Time Calculation (min): 45 min    Short Term Goals: Week 1:  OT Short Term Goal 1 (Week 1): Pt will tranfser to and from toilet with mi nA  OT Short Term Goal 1 - Progress (Week 1): Met OT Short Term Goal 2 (Week 1): Pt will don LB clothing with min A  OT Short Term Goal 2 - Progress (Week 1): Met OT Short Term Goal 3 (Week 1): Pt will locate items to her right using compensatory strageties with mod cuing  OT Short Term Goal 3 - Progress (Week 1): Partly met OT Short Term Goal 4 (Week 1): Pt will be oriented x4 with mod environmental cues OT Short Term Goal 4 - Progress (Week 1): Not met  Skilled Therapeutic Interventions/Progress Updates:    1:1 SElf care retraining at shower level with focus on functional ambulation around the room, orientation, functional problem solving, sequencing, etc. Pt oriented to name, and place but not time/date or situation. Pt able to bathe at shower level with encouragement and cues for sequencing and organization. Pt performed grooming at the sink with setup and extra time.    Pt participated in [place] on option A with focus on visual tracking in all directions. Pt noted to undershoot on the right and required more time to hit targets on the right.  Pt reported she felt "crossed eyed" "I keep missing the targets" and "easily frustrated."  Able to sustain attention for 3 min with min cues.  Engaged in game of black jack with min cues for counting up cards and playing according to the rules   Therapy Documentation Precautions:  Precautions Precautions: Fall Precaution Comments: TBI; visual deficits, very impulsive Restrictions Weight Bearing Restrictions: No General:   Vital Signs: Therapy Vitals Pulse Rate: 85 BP: 121/74 Pain:   c/o not feeling well--RN  aware ADL: ADL ADL Comments: see functional naviagtor  See Function Navigator for Current Functional Status.   Therapy/Group: Individual Therapy  Ebony Jones [facility] 07/12/2017, 1:28 PM

## 2017-07-12 NOTE — Progress Notes (Signed)
Social Work Patient ID: Ebony Jones, female   DOB: 05-Dec-1970, 46 y.o.   MRN: [id number]   Have reviewed team conference with both pt's mother and pt's boyfriend.  Both aware of targeted d/c date of 12/21 and supervision goals.  Mother states that she is "hopeful" that pt's boyfriend will agree for her to return home with him as she states she cannot provide 24/7 care to pt.  Boyfriend continues to express concerns about his ability to manage pt at home as well.  He is aware that pt's mother is not offering her home to pt and he is sympathetic to mother's concerns.  I have asked that they talk with one another about how they could work together to cover the 24/7 supervision.   Boyfriend will be here daily next week and will monitor how/ if pt's behavior changes or worsens with him.  At this point, we should continue to work toward a home d/c but do not have firm commitment from caregiver.    Ed Mandich, LCSW

## 2017-07-12 NOTE — Progress Notes (Addendum)
Physical Therapy Session Note  Patient Details  Name: Ebony Jones MRN: [id number] Date of Birth: 11-11-1970  Today's Date: 07/12/2017 PT Individual Time: 1045-1110 PT Individual Time Calculation (min): 25 min   Short Term Goals: Week 2:  PT Short Term Goal 1 (Week 2): pt will transfer with supervision consistently PT Short Term Goal 2 (Week 2): pt will perform gait without AD x 150' with supervision 3/4 trials PT Short Term Goal 3 (Week 2): pt will negotiate 12 steps 1 rail with supervision PT Short Term Goal 4 (Week 2): pt will attend to R during ambulation on her hall, to find her room without cues  Skilled Therapeutic Interventions/Progress Updates:    handoff from OT. Pt perseverating during session on being tired and being ready to go home. Pt stated several times she is just waiting for her boyfriend to come up to get her. Pt able to redirected throughout session. Focused on functional mobility and dynamic balance, cognitive remediation, and overall endurance. Pt requires overall steadying assist for gait without assistive device on unit with assist for obstacle negotiation due to impaired vision. Pt able to engage in therapeutic functional tasks to simulate home environment tasks such as putting away items on labeled shelves, cleaning items, and picking up things from various surfaces in home environment and gym environment with max verbal cues for attention and follow through of activities. End of session returned back to room and situated in enclosure bed.   Addendum: Journalist, newspaper for functional mobility and balance training for home environment simulation - pt reports having a 2 story home. When pt was in apartment and kitchen, pt was able to recall a previous activity in kitchen with another therapist.  Therapy Documentation Precautions:  Precautions Precautions: Fall Precaution Comments: TBI; visual deficits, very impulsive Restrictions Weight Bearing Restrictions:  No Pain:  Denies pain.    See Function Navigator for Current Functional Status.   Therapy/Group: Individual Therapy  Canary Brim Ivory Broad, PT, DPT  07/12/2017, 11:14 AM

## 2017-07-12 NOTE — Progress Notes (Signed)
[place] PHYSICAL MEDICINE & REHABILITATION     PROGRESS NOTE    Subjective/Complaints: No issues overnight. Resting quietly this am.   ROS: pt denies nausea, vomiting, diarrhea, cough, shortness of breath or chest pain     Objective: Vital Signs: Blood pressure 110/75, pulse 80, temperature 98.5 F (36.9 C), temperature source Oral, resp. rate 18, height 5\' 7"  (1.702 m), weight 66.5 kg (146 lb 9.7 oz), SpO2 100 %. No results found. No results for input(s): WBC, HGB, HCT, PLT in the last 72 hours. Recent Labs    07/10/17 0552  NA 136  K 4.0  CL 100*  GLUCOSE 84  BUN 9  CREATININE 0.57  CALCIUM 9.1   CBG (last 3)  No results for input(s): GLUCAP in the last 72 hours.  Wt Readings from Last 3 Encounters:  07/03/17 66.5 kg (146 lb 9.7 oz)  07/03/17 66.5 kg (146 lb 9.7 oz)  03/28/17 68 kg (150 lb)    Physical Exam:  Constitutional: She appearswell-developedand well-nourished.No distress.in enclosure bed HENT:  Head:Normocephalicand atraumatic.  Eyes: Mydriasis right eye.  Neck:Normal range of motion.Neck supple.  Cardiovascular:RRR without murmur. No JVD  .  Respiratory:CTA Bilaterally without wheezes or rales. Normal effort  XB:DZHG.Bowel sounds are normal. She exhibitsno distension. There isno tenderness.  Musculoskeletal: She exhibits noedemaor tenderness.  Neurological: Slow to arouse this morning.  Acranial nerve deficitis present.    Continued dysconjugate gaze, left eye with decr medial movement--essentially unchanged. Follows simple commands.  Moves all 4 limbs freely.  Skin: Skin iswarmand dry.  Psychiatric: Remains inattentive.     Assessment/Plan: 1. 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 Physiatrist is providing close team supervision and 24 hour management of active medical problems listed  below. Physiatrist and rehab team continue to assess barriers to discharge/monitor patient progress toward functional and medical goals.  Function:  Bathing Bathing position Bathing activity did not occur: Refused Position: Shower  Bathing parts Body parts bathed by patient: Right arm, Left arm, Chest, Abdomen, Front perineal area Body parts bathed by helper: Back  Bathing assist Assist Level: Touching or steadying assistance(Pt > 75%)   Set up : To obtain items  Upper Body Dressing/Undressing Upper body dressing   What is the patient wearing?: Pull over shirt/dress, Bra Bra - Perfomed by patient: Thread/unthread right bra strap, Thread/unthread left bra strap, Hook/unhook bra (pull down sports bra)   Pull over shirt/dress - Perfomed by patient: Thread/unthread right sleeve, Thread/unthread left sleeve, Put head through opening, Pull shirt over trunk Pull over shirt/dress - Perfomed by helper: Thread/unthread right sleeve, Thread/unthread left sleeve Button up shirt - Perfomed by patient: Pull shirt around back, Button/unbutton shirt Button up shirt - Perfomed by helper: Thread/unthread right sleeve, Thread/unthread left sleeve    Upper body assist Assist Level: Touching or steadying assistance(Pt > 75%)(donning in standing )      Lower Body Dressing/Undressing Lower body dressing   What is the patient wearing?: Non-skid slipper socks, Pants Underwear - Performed by patient: Thread/unthread right underwear leg, Thread/unthread left underwear leg, Pull underwear up/down Underwear - Performed by helper: Pull underwear up/down Pants- Performed by patient: Thread/unthread right pants leg, Thread/unthread left pants leg, Pull pants up/down Pants- Performed by helper: Pull pants up/down Non-skid slipper socks- Performed by patient: Don/doff right sock, Don/doff left sock  Lower body assist Assist for lower body dressing: Touching or steadying assistance (Pt >  75%)      Toileting Toileting Toileting activity did not occur: Refused Toileting steps completed by patient: Adjust clothing prior to toileting, Performs perineal hygiene, Adjust clothing after toileting Toileting steps completed by helper: Adjust clothing after toileting Toileting Assistive Devices: Grab bar or rail  Toileting assist Assist level: Touching or steadying assistance (Pt.75%)   Transfers Chair/bed transfer   Chair/bed transfer method: Ambulatory Chair/bed transfer assist level: Touching or steadying assistance (Pt > 75%) Chair/bed transfer assistive device: Armrests     Locomotion Ambulation     Max distance: 150 Assist level: Touching or steadying assistance (Pt > 75%)   Wheelchair   Type: Manual Max wheelchair distance: 50 Assist Level: Touching or steadying assistance (Pt > 75%)  Cognition Comprehension Comprehension assist level: Understands basic 25 - 49% of the time/ requires cueing 50 - 75% of the time  Expression Expression assist level: Expresses basic 25 - 49% of the time/requires cueing 50 - 75% of the time. Uses single words/gestures.  Social Interaction Social Interaction assist level: Interacts appropriately 25 - 49% of time - Needs frequent redirection.  Problem Solving Problem solving assist level: Solves basic less than 25% of the time - needs direction nearly all the time or does not effectively solve problems and may need a restraint for safety  Memory Memory assist level: Recognizes or recalls less than 25% of the time/requires cueing greater than 75% of the time   Medical Problem List and Plan: 1.Functional and cognitive deficitssecondary to TBI -continue therapies -RLAS  V+ 2. DVT Prophylaxis/Anticoagulation: Mechanical:Sequential compression devices, below kneeBilateral lower extremities 3.Headaches/Pain Management:tylenol prn 4. Mood:currently has no awareness of accident or deficits. LCSW to follow for  now and complete evaluation when appropriate 5. Neuropsych: This patientis notcapable of making decisions on herown behalf. -working to re-establish sleep-wake cycle    -continue HS seroquel at HS   -Initiated trial of propranolol for h/a and impulsivity  -sleep chart    -Increased methylphenidate for attention and arousal   to 10 mg -continue enclosure bed for patient safety  6. Skin/Wound Care:routine pressure relief measures. 7. Fluids/Electrolytes/Nutrition:[place] po intake   -labs wnl 8. ABLA: resolving.   -hgb up to 13.9   10. Optho: mydriasis right pupil, left CN III palsy-stable   -consvt care, patching recommended by optho   -outpt neuro-eye follow up at discharge depending upon recovery        LOS (Days) 9 A FACE TO [place] T, MD 07/12/2017 7:15 AM
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
## 2017-07-13 ENCOUNTER — Inpatient Hospital Stay (HOSPITAL_COMMUNITY): Payer: Medicaid Other

## 2017-07-13 ENCOUNTER — Inpatient Hospital Stay (HOSPITAL_COMMUNITY): Payer: Medicaid Other | Admitting: Speech Pathology

## 2017-07-13 ENCOUNTER — Inpatient Hospital Stay (HOSPITAL_COMMUNITY): Payer: Medicaid Other | Admitting: Physical Therapy

## 2017-07-13 ENCOUNTER — Inpatient Hospital Stay (HOSPITAL_COMMUNITY): Payer: Medicaid Other | Admitting: Occupational Therapy

## 2017-07-13 MED ORDER — NICOTINE 21 MG/24HR TD PT24
21.0000 mg | MEDICATED_PATCH | Freq: Every day | TRANSDERMAL | Status: DC
  Administered 2017-07-13 – 2017-07-25 (×13): 21 mg via TRANSDERMAL
  Filled 2017-07-13 (×12): qty 1

## 2017-07-13 NOTE — Progress Notes (Signed)
Occupational Therapy Session Note  Patient Details  Name: Ebony Jones MRN: [id number] Date of Birth: June 24, 1971  Today's Date: 07/13/2017 OT Individual Time: 1530-1556 OT Individual Time Calculation (min): 26 min    Short Term Goals: Week 2:  OT Short Term Goal 1 (Week 2): Pt will locate items on R using compensatory strategies with mod multimodal cues.  OT Short Term Goal 2 (Week 2): Pt will be orientated x 4 with mod multimodal cues. OT Short Term Goal 3 (Week 2): Pt will perform clothing management and hygiene after toileting with close supervision for balance and min cues for safety awareness.  Skilled Therapeutic Interventions/Progress Updates:    1:1. Pt disoriented to situation repeating throughout session she is "going home today" and "my boyfriend will be picking me up later." Despite redirection and reorientation, pt remained disoreinted and reluctant to participate in therapy. With pt reporting "there is nothing wrong with me" and "my vision sucks" OT selected visual scanning activity of locating 15 christmas ornaments around room with min VC for scanning R and supervision for ambulation. Pt places ornaments on christmas tree. Pt ambulates to ADL apartment with HHA and makes bed with Vc for orientation of sheet. Pt able to located all needed items to complete task. Exited session with pt enclosed in VAIL bed with father in room.  Therapy Documentation Precautions:  Precautions Precautions: Fall Precaution Comments: TBI; visual deficits, very impulsive Restrictions Weight Bearing Restrictions: No  See Function Navigator for Current Functional Status.   Therapy/Group: Individual Therapy  Tonny Branch 07/13/2017, 5:00 PM

## 2017-07-13 NOTE — Progress Notes (Signed)
Speech Language Pathology Daily Session Notes  Patient Details  Name: Ebony Jones MRN: [id number] Date of Birth: 1970-10-22  Today's Date: 07/13/2017  Session 1: SLP Individual Time: 1100-1200 SLP Individual Time Calculation (min): 60 min   Session 1: SLP Individual Time: 1430-1500 SLP Individual Time Calculation (min): 30 min  Short Term Goals: Week 2: SLP Short Term Goal 1 (Week 2): Patient will demonstrate sustained attention to task for ~5 minutes with Mod A verbal cues for redirection.  SLP Short Term Goal 2 (Week 2): Patient will tolerate regular texture diet with efficient mastication without overt s/s of aspiration and supervision cues for swallow safety.  SLP Short Term Goal 3 (Week 2): Patient will demonstrate functional problem solving for basic and familair tasks with Mod A multimodal cues.  SLP Short Term Goal 4 (Week 2): Patient will verbalize wants/needs within a familair context with Mod A multimodal cues.  SLP Short Term Goal 5 (Week 2): Patient will utilize external memory aids to recall basic, daily information with Mod A verbal and question cues. SLP Short Term Goal 6 (Week 2): Patient will consume trials of thin liquids via straw without overt s/s of aspiration over 2 sessions prior to advancement.   Skilled Therapeutic Interventions:  Session 1: Skilled treatment session focused on cognitive goals. SLP facilitated session by providing Total A for orientation to place, time and situation and for recall after a 2 minute delay. Patient preservative on going home and smoking cigarettes and required Max A verbal cues for redirection throughout task for ~1 minute intervals. Patient sorted items/followed 2-step commands for 100% accuracy and Mod I. Patient independently requested to use the bathroom and required Min-Mod A verbal cues for self-care tasks. Patient left upright in enclosure bed with all needs within reach. Continue with current plan of care.   Session 2: Skilled  treatment session focused on cognitive goals. SLP facilitated session by providing Min A verbal cues for problem solving while making her bed. Patient perseverative on going home but was able to be redirected this session and maintained topic of conversation for ~15 minutes with Min A verbal cues. Patient left upright in enclosure bed with all needs within reach. Continue with current plan of care.   Function:   Cognition Comprehension Comprehension assist level: Understands basic 25 - 49% of the time/ requires cueing 50 - 75% of the time  Expression   Expression assist level: Expresses basic 25 - 49% of the time/requires cueing 50 - 75% of the time. Uses single words/gestures.  Social Interaction Social Interaction assist level: Interacts appropriately 25 - 49% of time - Needs frequent redirection.  Problem Solving Problem solving assist level: Solves basic less than 25% of the time - needs direction nearly all the time or does not effectively solve problems and may need a restraint for safety  Memory Memory assist level: Recognizes or recalls less than 25% of the time/requires cueing greater than 75% of the time    Pain No/Denies Pain   Therapy/Group: Individual Therapy  [name] 07/13/2017, 3:13 PM

## 2017-07-13 NOTE — Progress Notes (Signed)
Resting throughout shift, remains in Enclosure Bed for safety with incident, reoriented x3, closely monitor and assisted with general needs, continent of bladder/bowel. Continue regime and monitor closely

## 2017-07-13 NOTE — Progress Notes (Signed)
[facility]IC[facility] & RE[facility]ILIT[facility]     PROGRESS NOTE    Subjective/Complaints: Patient slept well.  Aroused easily when I entered the room  ROS: Limited due to cognitive/behavioral    Objective: Vital Signs: Blood pressure 106/61, pulse 61, temperature 97.6 F (36.4 C), temperature source Oral, resp. rate 18, height 5\' 7"  (1.702 m), weight 66.5 kg (146 lb 9.7 oz), SpO2 100 %. No results found. No results for input(s): WBC, HGB, HCT, PLT in the last 72 hours. No results for input(s): NA, K, CL, GLUCOSE, BUN, CREATININE, CALCIUM in the last 72 hours.  Invalid input(s): CO CBG (last 3)  No results for input(s): GLUCAP in the last 72 hours.  Wt Readings from Last 3 Encounters:  07/03/17 66.5 kg (146 lb 9.7 oz)  07/03/17 66.5 kg (146 lb 9.7 oz)  03/28/17 68 kg (150 lb)    Physical Exam:  Constitutional: She appearswell-developedand well-nourished.No distress.in enclosure bed HENT:  Head:Normocephalicand atraumatic.  Eyes: Mydriasis right eye.  Neck:Normal range of motion.Neck supple.  Cardiovascular:RRR without murmur. No JVD  .  Respiratory:CTA Bilaterally without wheezes or rales. Normal effort   EN:IDPO.Bowel sounds are normal. She exhibitsno distension. There isno tenderness.  Musculoskeletal: She exhibits noedemaor tenderness.  Neurological: alert.  Acranial nerve deficitis present.    Continued dysconjugate gaze, left eye with decr medial movement--essentially unchanged. Follows simple commands.  Moves all 4 limbs freely. stable exam Skin: Skin iswarmand dry.  Psychiatric: Remains inattentive and distracted, impulsive     Assessment/Plan: 1. 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 Physiatrist is providing close team supervision and 24 hour management of active medical problems listed  below. Physiatrist and rehab team continue to assess barriers to discharge/monitor patient progress toward functional and medical goals.  Function:  Bathing Bathing position Bathing activity did not occur: Refused Position: Shower  Bathing parts Body parts bathed by patient: Right arm, Left arm, Chest, Abdomen, Front perineal area, Buttocks, Right upper leg, Left upper leg, Right lower leg, Left lower leg Body parts bathed by helper: Back  Bathing assist Assist Level: Touching or steadying assistance(Pt > 75%)   Set up : To obtain items  Upper Body Dressing/Undressing Upper body dressing   What is the patient wearing?: Pull over shirt/dress, Bra Bra - Perfomed by patient: Thread/unthread right bra strap, Thread/unthread left bra strap, Hook/unhook bra (pull down sports bra)   Pull over shirt/dress - Perfomed by patient: Thread/unthread right sleeve, Thread/unthread left sleeve, Put head through opening, Pull shirt over trunk Pull over shirt/dress - Perfomed by helper: Thread/unthread right sleeve, Thread/unthread left sleeve Button up shirt - Perfomed by patient: Pull shirt around back, Button/unbutton shirt Button up shirt - Perfomed by helper: Thread/unthread right sleeve, Thread/unthread left sleeve    Upper body assist Assist Level: Set up, Supervision or verbal cues      Lower Body Dressing/Undressing Lower body dressing   What is the patient wearing?: Non-skid slipper socks, Pants Underwear - Performed by patient: Thread/unthread right underwear leg, Thread/unthread left underwear leg, Pull underwear up/down Underwear - Performed by helper: Pull underwear up/down Pants- Performed by patient: Thread/unthread right pants leg, Thread/unthread left pants leg, Pull pants up/down Pants- Performed by helper: Pull pants up/down Non-skid slipper socks- Performed by patient: Don/doff right sock, Don/doff left sock  Lower body assist Assist for lower body  dressing: Touching or steadying assistance (Pt > 75%)      Toileting Toileting Toileting activity did not occur: Refused Toileting steps completed by patient: Adjust clothing prior to toileting, Performs perineal hygiene, Adjust clothing after toileting Toileting steps completed by helper: Adjust clothing after toileting Toileting Assistive Devices: Grab bar or rail  Toileting assist Assist level: Set up/obtain supplies   Transfers Chair/bed transfer   Chair/bed transfer method: Ambulatory Chair/bed transfer assist level: Touching or steadying assistance (Pt > 75%) Chair/bed transfer assistive device: Armrests     Locomotion Ambulation     Max distance: 250 Assist level: Touching or steadying assistance (Pt > 75%)   Wheelchair   Type: Manual Max wheelchair distance: 50 Assist Level: Touching or steadying assistance (Pt > 75%)  Cognition Comprehension Comprehension assist level: Understands basic 25 - 49% of the time/ requires cueing 50 - 75% of the time, Understands basic less than 25% of the time/ requires cueing >75% of the time  Expression Expression assist level: Expresses basic 25 - 49% of the time/requires cueing 50 - 75% of the time. Uses single words/gestures.  Social Interaction Social Interaction assist level: Interacts appropriately 25 - 49% of time - Needs frequent redirection.  Problem Solving Problem solving assist level: Solves basic less than 25% of the time - needs direction nearly all the time or does not effectively solve problems and may need a restraint for safety  Memory Memory assist level: Recognizes or recalls less than 25% of the time/requires cueing greater than 75% of the time   Medical Problem List and Plan: 1.Functional and cognitive deficitssecondary to TBI -continue therapies -RLAS  V+ 2. DVT Prophylaxis/Anticoagulation: Patient is ambulatory 3.Headaches/Pain Management:tylenol prn 4. Mood:currently has no  awareness of accident or deficits. LCSW to follow for now and complete evaluation when appropriate 5. Neuropsych: This patientis notcapable of making decisions on herown behalf. -working to re-establish sleep-wake cycle    -continue HS seroquel at HS   -continue trial propranolol for h/a and impulsivity.  Not sure how much further we can titrate this given current blood pressure and heart rate  -sleep chart    -Increased methylphenidate for attention and arousal   to 10 mg -continue enclosure bed for patient safety  6. Skin/Wound Care:routine pressure relief measures. 7. Fluids/Electrolytes/Nutrition: Continue to encourage p.o. intake   -labs wnl.  8. ABLA: resolving.   -hgb up to 13.9   10. Optho: mydriasis right pupil, left CN III palsy-stable   -consvt care, patching recommended by optho   -outpt neuro-eye follow up at discharge depending upon recovery        LOS (Days) 10 A FACE TO FACE EVALUATION WAS PERFORMED  Meredith Staggers, MD 07/13/2017 9:15 AM

## 2017-07-13 NOTE — Progress Notes (Signed)
Physical Therapy Session Note  Patient Details  Name: Ebony Jones MRN: [id number] Date of Birth: 11/30/1970  Today's Date: 07/13/2017 PT Individual Time: 1300-1410 PT Individual Time Calculation (min): 70 min   Short Term Goals: Week 2:  PT Short Term Goal 1 (Week 2): pt will transfer with supervision consistently PT Short Term Goal 2 (Week 2): pt will perform gait without AD x 150' with supervision 3/4 trials PT Short Term Goal 3 (Week 2): pt will negotiate 12 steps 1 rail with supervision PT Short Term Goal 4 (Week 2): pt will attend to R during ambulation on her hall, to find her room without cues  Skilled Therapeutic Interventions/Progress Updates: Pt received seated in veil bed, denies pain but reports "tired and don't feel good", unable to further describe. Pt perseverative on going home, obtaining wine and cigarettes, and where boyfriend is throughout session requiring frequent redirection. Pt ambulates throughout unit with S; occasional staggering but does not require physical assist to recover balance while performing various functional tasks such as getting a drink from nurses station. Pt engaged in pipe tree task requiring max cueing for problem solving, min cues for maintaining focus to task >5 min. Pt used restroom x3 during session with S; RN alerted to urinary urgency and small void amount. Pt performed floor transfer with S; educated on high risk for falls and goal of independence and safety with fall recovery. Returned to veil bed at end of session, all needs in reach.      Therapy Documentation Precautions:  Precautions Precautions: Fall Precaution Comments: TBI; visual deficits, very impulsive Restrictions Weight Bearing Restrictions: No   See Function Navigator for Current Functional Status.   Therapy/Group: Individual Therapy  [name] 07/13/2017, 2:11 PM

## 2017-07-13 NOTE — Progress Notes (Signed)
Occupational Therapy Session Note  Patient Details  Name: Delva Derden MRN: [id number] Date of Birth: Jan 03, 1971  Today's Date: 07/13/2017 OT Individual Time: 1194-1740 OT Individual Time Calculation (min): 60 min   Short Term Goals: Week 2:  OT Short Term Goal 1 (Week 2): Pt will locate items on R using compensatory strategies with mod multimodal cues.  OT Short Term Goal 2 (Week 2): Pt will be orientated x 4 with mod multimodal cues. OT Short Term Goal 3 (Week 2): Pt will perform clothing management and hygiene after toileting with close supervision for balance and min cues for safety awareness.  Skilled Therapeutic Interventions/Progress Updates:    Tx focus on orientation, awareness, vision, memory, and sustained attention during functional tasks.   Pt greeted at RN station, perseverating on going home. "There's nothing wrong with me. I'm fine. I need to go home." Pt escorted back to room and educated her on her hospitalization and current deficits. Pt unreceptive to education, still emphatic she was leaving facility today. She ate breakfast with supervision, requiring mod cues for attention due to tangential tendencies. Pt able to visually locate all items on plate. She was eventually agreeable to walk to family room to prepare coffee (declining all self care activities). She ambulated with Min A down hallway in moderately stimulating environment with max directional cues. Once at destination, she required max step by step cues for sequencing during coffee prep. Pt sitting on couch afterwards, and had her read words and phrases in a cooking magazine. Pt able to accurately read 90% of larger words with print held at arms length and positioned on Lt side. Pt closing Rt eye to read 50% of the time, then later she closed her Lt eye to read with the Rt. Had her attend to naming all fish on a page, with max cues for attention and recalling what she had just read. Discussed her personal use of several  kitchen appliances after she read descriptions about them. Also did the same for holiday recipes. Once she ambulated back to room, pt unable to recall any of the articles/items we read about when provided with max phonetic cuing. Pt left in recliner with safety belt applied at RN station.   Therapy Documentation Precautions:  Precautions Precautions: Fall Precaution Comments: TBI; visual deficits, very impulsive Restrictions Weight Bearing Restrictions: No Vital Signs: Therapy Vitals BP: 128/70 Pain: No c/o pain during tx  Pain Assessment Pain Assessment: No/denies pain PAINAD (Pain Assessment in Advanced Dementia) Breathing: normal Negative Vocalization: none Facial Expression: facial grimacing Body Language: relaxed Consolability: no need to console PAINAD Score: 2 ADL: ADL ADL Comments: see functional naviagtor     See Function Navigator for Current Functional Status.   Therapy/Group: Individual Therapy  Charizma Gardiner A Deirdre Gryder 07/13/2017, 12:27 PM

## 2017-07-14 ENCOUNTER — Inpatient Hospital Stay (HOSPITAL_COMMUNITY): Payer: Medicaid Other | Admitting: Physical Therapy

## 2017-07-14 MED ORDER — METHYLPHENIDATE HCL 5 MG PO TABS
5.0000 mg | ORAL_TABLET | Freq: Two times a day (BID) | ORAL | Status: DC
  Administered 2017-07-15 – 2017-07-17 (×5): 5 mg via ORAL
  Filled 2017-07-14 (×4): qty 1

## 2017-07-14 NOTE — Progress Notes (Signed)
Physical Therapy Session Note  Patient Details  Name: Ebony Jones MRN: [id number] Date of Birth: 1971-01-08  Today's Date: 07/14/2017 PT Individual Time: 1415-1458 PT Individual Time Calculation (min): 43 min   Short Term Goals: Week 2:  PT Short Term Goal 1 (Week 2): pt will transfer with supervision consistently PT Short Term Goal 2 (Week 2): pt will perform gait without AD x 150' with supervision 3/4 trials PT Short Term Goal 3 (Week 2): pt will negotiate 12 steps 1 rail with supervision PT Short Term Goal 4 (Week 2): pt will attend to R during ambulation on her hall, to find her room without cues  Skilled Therapeutic Interventions/Progress Updates:   Pt in recliner upon arrival and agreeable to therapy, no c/o pain. Worked on dynamic gait while ambulating around unit in 150-250' bouts w/ object manipulation using both UEs and LEs. Performed functional and dynamic tasks in kitchen while baking brownies and additionally performed [employer] Scale as detailed below. Maximal verbal cues for pt to attend to task and to orient to environment. Pt perseverating on going home today, difficulty w/ remembering tasks we worked on 15-20 minutes earlier in session. Returned to room and ended session in recliner w/ quick release belt engaged, in care of family and all needs met.   Therapy Documentation Precautions:  Precautions Precautions: Fall Precaution Comments: TBI; visual deficits, very impulsive Restrictions Weight Bearing Restrictions: No Balance: Balance Balance Assessed: Yes Standardized Balance Assessment Standardized Balance Assessment: Berg Balance Test Berg Balance Test Sit to Stand: Able to stand without using hands and stabilize independently Standing Unsupported: Able to stand safely 2 minutes Sitting with Back Unsupported but Feet Supported on Floor or Stool: Able to sit safely and securely 2 minutes Stand to Sit: Sits safely with minimal use of hands Transfers: Able to  transfer safely, minor use of hands Standing Unsupported with Eyes Closed: Able to stand 10 seconds safely Standing Ubsupported with Feet Together: Able to place feet together independently and stand 1 minute safely From Standing, Reach Forward with Outstretched Arm: Can reach confidently >25 cm (10") From Standing Position, Pick up Object from Floor: Able to pick up shoe safely and easily From Standing Position, Turn to Look Behind Over each Shoulder: Looks behind from both sides and weight shifts well Turn 360 Degrees: Able to turn 360 degrees safely one side only in 4 seconds or less Standing Unsupported, Alternately Place Feet on Step/Stool: Able to complete 4 steps without aid or supervision Standing Unsupported, One Foot in Front: Able to plae foot ahead of the other independently and hold 30 seconds Standing on One Leg: Tries to lift leg/unable to hold 3 seconds but remains standing independently Total Score: 49  See Function Navigator for Current Functional Status.   Therapy/Group: Individual Therapy  Ian Castagna K Arnette 07/14/2017, 2:59 PM

## 2017-07-14 NOTE — Progress Notes (Signed)
[facility]ILIT[facility]     PROGRESS NOTE    Subjective/Complaints: Awake and alert wants to go home.  We discussed that she had a brain injury and she was not aware of this.  ROS: Limited due to cognitive/behavioral    Objective: Vital Signs: Blood pressure 108/77, pulse 98, temperature 97.7 F (36.5 C), temperature source Oral, resp. rate 18, height 5\' 7"  (1.702 m), weight 66.5 kg (146 lb 9.7 oz), SpO2 96 %. No results found. No results for input(s): WBC, HGB, HCT, PLT in the last 72 hours. No results for input(s): NA, K, CL, GLUCOSE, BUN, CREATININE, CALCIUM in the last 72 hours.  Invalid input(s): CO CBG (last 3)  No results for input(s): GLUCAP in the last 72 hours.  Wt Readings from Last 3 Encounters:  07/03/17 66.5 kg (146 lb 9.7 oz)  07/03/17 66.5 kg (146 lb 9.7 oz)  03/28/17 68 kg (150 lb)    Physical Exam:  Constitutional: She appearswell-developedand well-nourished.No distress.in enclosure bed HENT:  Head:Normocephalicand atraumatic.  Eyes: Mydriasis right eye.  Neck:Normal range of motion.Neck supple.  Cardiovascular:RRR without murmur. No JVD  .  Respiratory:CTA Bilaterally without wheezes or rales. Normal effort   JI:RCVE.Bowel sounds are normal. She exhibitsno distension. There isno tenderness.  Musculoskeletal: She exhibits noedemaor tenderness.  Neurological: alert.  Acranial nerve deficitis present.    Continued dysconjugate gaze, left eye with decr medial movement--essentially unchanged. Follows simple commands.  Moves all 4 limbs freely. stable exam Skin: Skin iswarmand dry.  Psychiatric: Remains inattentive and distracted, impulsive     Assessment/Plan: 1. 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 Physiatrist is providing close team supervision and 24 hour management  of active medical problems listed below. Physiatrist and rehab team continue to assess barriers to discharge/monitor patient progress toward functional and medical goals.  Function:  Bathing Bathing position Bathing activity did not occur: Refused Position: Shower  Bathing parts Body parts bathed by patient: Right arm, Left arm, Chest, Abdomen, Front perineal area, Buttocks, Right upper leg, Left upper leg, Right lower leg, Left lower leg Body parts bathed by helper: Back  Bathing assist Assist Level: Touching or steadying assistance(Pt > 75%)   Set up : To obtain items  Upper Body Dressing/Undressing Upper body dressing   What is the patient wearing?: Pull over shirt/dress, Bra Bra - Perfomed by patient: Thread/unthread right bra strap, Thread/unthread left bra strap, Hook/unhook bra (pull down sports bra)   Pull over shirt/dress - Perfomed by patient: Thread/unthread right sleeve, Thread/unthread left sleeve, Put head through opening, Pull shirt over trunk Pull over shirt/dress - Perfomed by helper: Thread/unthread right sleeve, Thread/unthread left sleeve Button up shirt - Perfomed by patient: Pull shirt around back, Button/unbutton shirt Button up shirt - Perfomed by helper: Thread/unthread right sleeve, Thread/unthread left sleeve    Upper body assist Assist Level: Set up, Supervision or verbal cues      Lower Body Dressing/Undressing Lower body dressing   What is the patient wearing?: Non-skid slipper socks, Pants Underwear - Performed by patient: Thread/unthread right underwear leg, Thread/unthread left underwear leg, Pull underwear up/down Underwear - Performed by helper: Pull underwear up/down Pants- Performed by patient: Thread/unthread right pants leg, Thread/unthread left pants leg, Pull pants up/down Pants- Performed by helper: Pull pants up/down Non-skid slipper socks- Performed by patient: Don/doff right sock, Don/doff left sock  Lower body  assist Assist for lower body dressing: Touching or steadying assistance (Pt > 75%)      Toileting Toileting Toileting activity did not occur: Refused Toileting steps completed by patient: Adjust clothing prior to toileting, Performs perineal hygiene, Adjust clothing after toileting Toileting steps completed by helper: Adjust clothing after toileting Toileting Assistive Devices: Grab bar or rail  Toileting assist Assist level: Supervision or verbal cues   Transfers Chair/bed transfer   Chair/bed transfer method: Ambulatory Chair/bed transfer assist level: Supervision or verbal cues Chair/bed transfer assistive device: Armrests     Locomotion Ambulation     Max distance: 175 Assist level: Supervision or verbal cues   Wheelchair   Type: Manual Max wheelchair distance: 50 Assist Level: Touching or steadying assistance (Pt > 75%)  Cognition Comprehension Comprehension assist level: Understands basic 25 - 49% of the time/ requires cueing 50 - 75% of the time  Expression Expression assist level: Expresses basic 25 - 49% of the time/requires cueing 50 - 75% of the time. Uses single words/gestures.  Social Interaction Social Interaction assist level: Interacts appropriately 25 - 49% of time - Needs frequent redirection.  Problem Solving Problem solving assist level: Solves basic less than 25% of the time - needs direction nearly all the time or does not effectively solve problems and may need a restraint for safety  Memory Memory assist level: Recognizes or recalls less than 25% of the time/requires cueing greater than 75% of the time   Medical Problem List and Plan: 1.Functional and cognitive deficitssecondary to TBI -continue therapies -RLAS  V+, poor awareness of deficits 2. DVT Prophylaxis/Anticoagulation: Patient is ambulatory 3.Headaches/Pain Management:tylenol prn 4. Mood:currently has no awareness of accident or deficits. LCSW to follow for now  and complete evaluation when appropriate 5. Neuropsych: This patientis notcapable of making decisions on herown behalf. -working to re-establish sleep-wake cycle    -continue HS seroquel at HS   -continue trial propranolol for h/a and impulsivity.  Not sure how much  further we can titrate this given current blood pressure and heart rate  -sleep chart    -Increased methylphenidate for attention and arousal   to 10 mg, becoming mildly overstimulated will reduce to 5 mg -continue enclosure bed for patient safety  6. Skin/Wound Care:routine pressure relief measures. 7. Fluids/Electrolytes/Nutrition: Continue to encourage p.o. intake, intake 840 mL on 07/13/2017   -labs wnl.  8. ABLA: resolving.   -hgb up to 13.9   10. Optho: mydriasis right pupil, left CN III palsy-stable   -consvt care, patching recommended by optho   -outpt neuro-eye follow up at discharge depending upon recovery        LOS (Days) [place] E Jishnu Jenniges, MD 07/14/2017 12:16 PM

## 2017-07-16 ENCOUNTER — Inpatient Hospital Stay (HOSPITAL_COMMUNITY): Payer: Medicaid Other

## 2017-07-16 ENCOUNTER — Inpatient Hospital Stay (HOSPITAL_COMMUNITY): Payer: Medicaid Other | Admitting: Speech Pathology

## 2017-07-16 ENCOUNTER — Inpatient Hospital Stay (HOSPITAL_COMMUNITY): Payer: Medicaid Other | Admitting: Occupational Therapy

## 2017-07-16 NOTE — Progress Notes (Signed)
Lare entry Patient noted somewhat anxious, frustrated,and  requesting a cigarette and some ETOH to drink," ,she also states she need to leave and go home (" Im okay now, my family need me at home). Observed on several occassions using her TV control as a phone to call her boyfriend ,then placed apparatus up to her mouth and ear to get a response,,Redirected with support and reassurances provided. A  telephone call was made directly to Bearl Mulberry per this  patient request..Patient appeared more relaxed after this occurrence.and cooperative.Continue support and monitoring. Remains in Enclosure bed per protocol and monitoring

## 2017-07-16 NOTE — Progress Notes (Addendum)
Physical Therapy Note  Patient Details  Name: Crystel Demarco MRN: [id number] Date of Birth: Dec 18, 1970 Today's Date: 07/16/2017  1300-1400, 60 min individual tx Pain: none pt pt  Pt stated she felt fine, and needed to go home.  When PT prompted her about vision problems, she stated that her vision would be fine if she would just get her little red pill that a doctor in [place] prescribes her, which keeps her calm.  PT redirected pt.  neuromuscular re-education via forced use for alternating reciprocal movement x 4 extremities on NuStep at level 5 x 6 minutes.    Therapeutic activites in standing involving transporting large bulky wedges and bolsters and rearranging /organizing on shelf with 1 cue.  In standing, pt also put together a puzzle with 15 vertical bar pieces on table top,  needing extra time, max cues for finding errors.  Pt held R eye closed during this activity, and declined use of patch.   Gait on mulched area with min assist for steadiness, up/down 2 flights of 10 steps in stair well with bil hands on R rail, close supervision, self selected step- to when descending.  Gait training x 330' on level tile, with rare cue for obstacles.  At times, pt did bump into obstacles on L with L shoulder, but remembered and anticipated obstacle on subsequent trials, without cues. Toilet transfer for continent voiding.  Pt left resting in recliner with quick release belt applied, watching movie in day room with other pts.  See function navigator for current status.  Daveyon Kitchings 07/16/2017, 7:56 AM

## 2017-07-16 NOTE — Progress Notes (Signed)
Speech Language Pathology Daily Session Note  Patient Details  Name: Ebony Jones MRN: [id number] Date of Birth: 14-Nov-1970  Today's Date: 07/16/2017 SLP Individual Time: 1110-1225 SLP Individual Time Calculation (min): 75 min  Short Term Goals: Week 2: SLP Short Term Goal 1 (Week 2): Patient will demonstrate sustained attention to task for ~5 minutes with Mod A verbal cues for redirection.  SLP Short Term Goal 2 (Week 2): Patient will tolerate regular texture diet with efficient mastication without overt s/s of aspiration and supervision cues for swallow safety.  SLP Short Term Goal 3 (Week 2): Patient will demonstrate functional problem solving for basic and familair tasks with Mod A multimodal cues.  SLP Short Term Goal 4 (Week 2): Patient will verbalize wants/needs within a familair context with Mod A multimodal cues.  SLP Short Term Goal 5 (Week 2): Patient will utilize external memory aids to recall basic, daily information with Mod A verbal and question cues. SLP Short Term Goal 6 (Week 2): Patient will consume trials of thin liquids via straw without overt s/s of aspiration over 2 sessions prior to advancement.   Skilled Therapeutic Interventions:Skilled treatment session focused on cognition goals. SLP received pt in bed and asleep. Pt required more than a reasonable amount of time to arouse and Total A for participation in ST session d/t confusion for need of ST session. Continued redirection was needed throughout session for attention to task, sorting 2 colors, orientation as well as visual deficits. Pt perseverative on going home, having a smoke or ETOH. Pt left in enclosure bed and will needs within reach.      Function:  Eating Eating                 Cognition Comprehension Comprehension assist level: Understands basic 25 - 49% of the time/ requires cueing 50 - 75% of the time  Expression   Expression assist level: Expresses basic 25 - 49% of the time/requires cueing  50 - 75% of the time. Uses single words/gestures.  Social Interaction Social Interaction assist level: Interacts appropriately 25 - 49% of time - Needs frequent redirection.  Problem Solving Problem solving assist level: Solves basic less than 25% of the time - needs direction nearly all the time or does not effectively solve problems and may need a restraint for safety  Memory Memory assist level: Recognizes or recalls less than 25% of the time/requires cueing greater than 75% of the time    Pain    Therapy/Group: Individual Therapy  Rey Fors 07/16/2017, 4:13 PM

## 2017-07-16 NOTE — Progress Notes (Signed)
[facility] MEDICINE & REHABILITATION     PROGRESS NOTE    Subjective/Complaints: Still asking about going home.  Feels that she is improved enough where she can go home.  Knows that she is in the hospital  ROS: Limited due to cognitive/behavioral   Objective: Vital Signs: Blood pressure 111/74, pulse 87, temperature 97.6 F (36.4 C), temperature source Oral, resp. rate 18, height 5\' 7"  (1.702 m), weight 66.5 kg (146 lb 9.7 oz), SpO2 99 %. No results found. No results for input(s): WBC, HGB, HCT, PLT in the last 72 hours. No results for input(s): NA, K, CL, GLUCOSE, BUN, CREATININE, CALCIUM in the last 72 hours.  Invalid input(s): CO CBG (last 3)  No results for input(s): GLUCAP in the last 72 hours.  Wt Readings from Last 3 Encounters:  07/03/17 66.5 kg (146 lb 9.7 oz)  07/03/17 66.5 kg (146 lb 9.7 oz)  03/28/17 68 kg (150 lb)    Physical Exam:  Constitutional: She appearswell-developedand well-nourished.No distress.in enclosure bed HENT:  Head:Normocephalicand atraumatic.  Eyes: Mydriasis right eye.  Neck:Normal range of motion.Neck supple.  Cardiovascular:RRR without murmur. No JVD  .  Respiratory:CTA Bilaterally without wheezes or rales. Normal effort    WN:UUVO.Bowel sounds are normal. She exhibitsno distension. There isno tenderness.  Musculoskeletal: She exhibits noedemaor tenderness.  Neurological: alert.  Acranial nerve deficitis present.    Continued dysconjugate gaze, left eye with decr medial movement--essentially unchanged. Follows simple commands.  Moves all 4 limbs freely. stable exam Skin: Skin iswarmand dry.  Psychiatric: Distracted and impulsive.  Follows simple commands.  Limited insight and awareness     Assessment/Plan: 1. 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 Physiatrist is  providing close team supervision and 24 hour management of active medical problems listed below. Physiatrist and rehab team continue to assess barriers to discharge/monitor patient progress toward functional and medical goals.  Function:  Bathing Bathing position Bathing activity did not occur: Refused Position: Shower  Bathing parts Body parts bathed by patient: Right arm, Left arm, Chest, Abdomen, Front perineal area, Buttocks, Right upper leg, Left upper leg, Right lower leg, Left lower leg Body parts bathed by helper: Back  Bathing assist Assist Level: Touching or steadying assistance(Pt > 75%)   Set up : To obtain items  Upper Body Dressing/Undressing Upper body dressing   What is the patient wearing?: Pull over shirt/dress, Bra Bra - Perfomed by patient: Thread/unthread right bra strap, Thread/unthread left bra strap, Hook/unhook bra (pull down sports bra)   Pull over shirt/dress - Perfomed by patient: Thread/unthread right sleeve, Thread/unthread left sleeve, Put head through opening, Pull shirt over trunk Pull over shirt/dress - Perfomed by helper: Thread/unthread right sleeve, Thread/unthread left sleeve Button up shirt - Perfomed by patient: Pull shirt around back, Button/unbutton shirt Button up shirt - Perfomed by helper: Thread/unthread right sleeve, Thread/unthread left sleeve    Upper body assist Assist Level: Set up, Supervision or verbal cues      Lower Body Dressing/Undressing Lower body dressing   What is the patient wearing?: Non-skid slipper socks, Pants Underwear - Performed by patient: Thread/unthread right underwear leg, Thread/unthread left underwear leg, Pull underwear up/down Underwear - Performed by helper: Pull underwear up/down Pants- Performed by patient: Thread/unthread right pants leg, Thread/unthread left pants leg, Pull pants up/down Pants- Performed by helper: Pull pants up/down Non-skid slipper socks- Performed by patient: Don/doff right sock,  Don/doff left sock                    Lower body assist Assist for lower body dressing: Touching or steadying assistance (Pt > 75%)      Toileting Toileting Toileting activity did not occur: Refused Toileting steps completed by patient: Adjust clothing prior to toileting, Performs perineal hygiene, Adjust clothing after toileting Toileting steps completed by helper: Adjust clothing after toileting Toileting Assistive Devices: Grab bar or rail  Toileting assist Assist level: Supervision or verbal cues   Transfers Chair/bed transfer   Chair/bed transfer method: Ambulatory Chair/bed transfer assist level: Supervision or verbal cues Chair/bed transfer assistive device: Armrests     Locomotion Ambulation     Max distance: 250' Assist level: Supervision or verbal cues   Wheelchair   Type: Manual Max wheelchair distance: 50 Assist Level: Touching or steadying assistance (Pt > 75%)  Cognition Comprehension Comprehension assist level: Understands basic 25 - 49% of the time/ requires cueing 50 - 75% of the time  Expression Expression assist level: Expresses basic 25 - 49% of the time/requires cueing 50 - 75% of the time. Uses single words/gestures.  Social Interaction Social Interaction assist level: Interacts appropriately 25 - 49% of time - Needs frequent redirection.  Problem Solving Problem solving assist level: Solves basic less than 25% of the time - needs direction nearly all the time or does not effectively solve problems and may need a restraint for safety  Memory Memory assist level: Recognizes or recalls less than 25% of the time/requires cueing greater than 75% of the time   Medical Problem List and Plan: 1.Functional and cognitive deficitssecondary to TBI -continue therapies -RLAS  V+, poor awareness of deficits 2. DVT Prophylaxis/Anticoagulation: Patient is ambulatory 3.Headaches/Pain Management:tylenol prn 4. Mood:currently has no  awareness of accident or deficits. LCSW to follow for now and complete evaluation when appropriate 5. Neuropsych: This patientis notcapable of making decisions on herown behalf. -continue HS seroquel at HS   -continue trial propranolol for h/a and impulsivity.  Not sure how much  further we can titrate this given current blood pressure and heart rate  -sleep chart    -Methylphenidate and reduced to 5 mg due to perceived overstimulation   -continue enclosure bed for patient safety  6. Skin/Wound Care:routine pressure relief measures. 7. Fluids/Electrolytes/Nutrition: Continue to encourage p.o. intake, intake 840 mL on 07/13/2017   -labs wnl.  8. ABLA: resolving.   -hgb up to 13.9   10. Optho: mydriasis right pupil, left CN III palsy-stable   -consvt care, patching recommended by optho   -outpt neuro-eye follow up at discharge depending upon recovery        LOS (Days) 13 A Fontanelle T, MD 07/16/2017 3:23 PM

## 2017-07-16 NOTE — Progress Notes (Signed)
Occupational Therapy Session Note  Patient Details  Name: Ebony Jones MRN: [id number] Date of Birth: 1971-04-12  Today's Date: 07/16/2017 OT Individual Time: 9826-4158 OT Individual Time Calculation (min): 55 min   Short Term Goals: Week 2:  OT Short Term Goal 1 (Week 2): Pt will locate items on R using compensatory strategies with mod multimodal cues.  OT Short Term Goal 2 (Week 2): Pt will be orientated x 4 with mod multimodal cues. OT Short Term Goal 3 (Week 2): Pt will perform clothing management and hygiene after toileting with close supervision for balance and min cues for safety awareness.  Skilled Therapeutic Interventions/Progress Updates:    Tx focus on ADL retraining, attention, balance, awareness, and functional ambulation without device.   Pt greeted in enclosure bed, requesting to get OOB and complete self care tasks. Pt ambulating with HHA to bathroom and completed toilet transfer/toileting. She then transitioned to TTB. Required cues for removing all clothing, managing temperature controls, sequencing, and visually scanning items with decreased background contrast. She then ambulated to therapy office with directional cues and HHA to retrieve hair dryer. Pt blowdrying hair in standing for dynamic balance challenges, attending to task for 2 minutes. Oral care completed afterwards in standing with cues for sequencing. Once returning hair dryer to therapy office (with max cues for direction), she ambulated to family room and prepared coffee, required min instructional cues for sequencing today! Able to read k-cups via Lt visual field with pt turning head to Rt to see. Pt then sat on couch and drank coffee, able to read words on TV screen about the snowstorm. Pt asking orientation questions and OT clarified these for her. She then ambulated back to room and transferred back to bed. Changed her bed linen with pt assisting with doffing/donning pillowcases. She was safely secured in  enclosure bed at session exit with all needs within reach.   Therapy Documentation Precautions:  Precautions Precautions: Fall Precaution Comments: TBI; visual deficits, very impulsive Restrictions Weight Bearing Restrictions: No Pain: No c/o pain during tx    ADL: ADL ADL Comments: see functional naviagtor     See Function Navigator for Current Functional Status.   Therapy/Group: Individual Therapy  Evonte Prestage A Imari Sivertsen 07/16/2017, 12:21 PM

## 2017-07-17 ENCOUNTER — Inpatient Hospital Stay (HOSPITAL_COMMUNITY): Payer: Medicaid Other | Admitting: Occupational Therapy

## 2017-07-17 ENCOUNTER — Inpatient Hospital Stay (HOSPITAL_COMMUNITY): Payer: Medicaid Other | Admitting: Physical Therapy

## 2017-07-17 ENCOUNTER — Inpatient Hospital Stay (HOSPITAL_COMMUNITY): Payer: Medicaid Other | Admitting: Speech Pathology

## 2017-07-17 ENCOUNTER — Other Ambulatory Visit: Payer: Self-pay

## 2017-07-17 LAB — BASIC METABOLIC PANEL
Anion gap: 7 (ref 5–15)
BUN: 9 mg/dL (ref 6–20)
CO2: 28 mmol/L (ref 22–32)
Calcium: 9.1 mg/dL (ref 8.9–10.3)
Chloride: 101 mmol/L (ref 101–111)
Creatinine, Ser: 0.66 mg/dL (ref 0.44–1.00)
GFR calc Af Amer: >60 mL/min (ref >60)
GFR calc non Af Amer: >60 mL/min (ref >60)
Glucose, Bld: 78 mg/dL (ref 65–99)
Potassium: 3.8 mmol/L (ref 3.5–5.1)
Sodium: 136 mmol/L (ref 135–145)

## 2017-07-17 MED ORDER — METHYLPHENIDATE HCL 5 MG PO TABS
10.0000 mg | ORAL_TABLET | Freq: Two times a day (BID) | ORAL | Status: DC
Start: 2017-07-17 — End: 2017-07-25
  Administered 2017-07-17 – 2017-07-25 (×16): 10 mg via ORAL
  Filled 2017-07-17 (×18): qty 2

## 2017-07-17 NOTE — Progress Notes (Signed)
Occupational Therapy Session Note  Patient Details  Name: Ebony Jones MRN: [id number] Date of Birth: Dec 06, 1970  Today's Date: 07/17/2017 OT Individual Time: 1045-1130 OT Individual Time Calculation (min): 45 min    Short Term Goals: Week 1:  OT Short Term Goal 1 (Week 1): Pt will tranfser to and from toilet with mi nA  OT Short Term Goal 1 - Progress (Week 1): Met OT Short Term Goal 2 (Week 1): Pt will don LB clothing with min A  OT Short Term Goal 2 - Progress (Week 1): Met OT Short Term Goal 3 (Week 1): Pt will locate items to her right using compensatory strageties with mod cuing  OT Short Term Goal 3 - Progress (Week 1): Partly met OT Short Term Goal 4 (Week 1): Pt will be oriented x4 with mod environmental cues OT Short Term Goal 4 - Progress (Week 1): Not met Week 2:  OT Short Term Goal 1 (Week 2): Pt will locate items on R using compensatory strategies with mod multimodal cues.  OT Short Term Goal 2 (Week 2): Pt will be orientated x 4 with mod multimodal cues. OT Short Term Goal 3 (Week 2): Pt will perform clothing management and hygiene after toileting with close supervision for balance and min cues for safety awareness.  Skilled Therapeutic Interventions/Progress Updates:    1:1 Pt in vail bed when arrived. Pt reported on her own she needed to get up and get ready for the day. Pt precede to go and pick out her clothing and get into the shower (already turned on). Pt showered with min to mod questioning cues and showered in standing and shaved her legs in standing (with leg propped up on grab bar). Pt dressed with distant supervision in the bathroom and came out and dried and fixed hair.  Brushed teeth with distant supervision withtuo cues.  Pt able to participate in more concrete visual assessment. Pt reports blurriness in all fields in right eye. 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  Pt continues  to still tilt head to the right to read out of the left corner of left eye but suspect it is perceptual challenge.  Pt also reports she wears glasses PTA.  Pt also with very poor working and short term memory requiring max A.   Therapy Documentation Precautions:  Precautions Precautions: Fall Precaution Comments: TBI; visual deficits, very impulsive Restrictions Weight Bearing Restrictions: No  Pain:  no c/o pain in session  ADL: ADL ADL Comments: see functional naviagtor  See Function Navigator for Current Functional Status.   Therapy/Group: Individual Therapy  Willeen Cass [facility] 07/17/2017, 11:52 AM

## 2017-07-17 NOTE — Progress Notes (Signed)
[facility] PHYSICAL MEDICINE & REHABILITATION     PROGRESS NOTE    Subjective/Complaints: Still asking about going home.  Feels that she is improved enough where she can go home.  Knows that she is in the hospital  ROS: Limited due to cognitive/behavioral   Objective: Vital Signs: Blood pressure 107/62, pulse (!) 58, temperature 97.8 F (36.6 C), temperature source Oral, resp. rate 16, height 5\' 7"  (1.702 m), weight 66.5 kg (146 lb 9.7 oz), SpO2 97 %. No results found. No results for input(s): WBC, HGB, HCT, PLT in the last 72 hours. Recent Labs    07/17/17 0512  NA 136  K 3.8  CL 101  GLUCOSE 78  BUN 9  CREATININE 0.66  CALCIUM 9.1   CBG (last 3)  No results for input(s): GLUCAP in the last 72 hours.  Wt Readings from Last 3 Encounters:  07/03/17 66.5 kg (146 lb 9.7 oz)  07/03/17 66.5 kg (146 lb 9.7 oz)  03/28/17 68 kg (150 lb)    Physical Exam:  Constitutional: She appearswell-developedand well-nourished.No distress.in enclosure bed HENT:  Head:Normocephalicand atraumatic.  Eyes: Mydriasis right eye.  Neck:Normal range of motion.Neck supple.  Cardiovascular:RRR without murmur. No JVD  .  Respiratory:CTA Bilaterally without wheezes or rales. Normal effort    IR:WERX.Bowel sounds are normal. She exhibitsno distension. There isno tenderness.  Musculoskeletal: She exhibits noedemaor tenderness.  Neurological: alert.  Acranial nerve deficitis present.    Continued dysconjugate gaze, left eye with decr medial movement--essentially unchanged. Follows simple commands.  Moves all 4 limbs freely. stable exam Skin: Skin iswarmand dry.  Psychiatric: Distracted and impulsive.  Follows simple commands.  Limited insight and awareness     Assessment/Plan: 1. 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 Physiatrist is  providing close team supervision and 24 hour management of active medical problems listed below. Physiatrist and rehab team continue to assess barriers to discharge/monitor patient progress toward functional and medical goals.  Function:  Bathing Bathing position Bathing activity did not occur: Refused Position: Shower  Bathing parts Body parts bathed by patient: Right arm, Left arm, Chest, Abdomen, Front perineal area, Buttocks, Right upper leg, Left upper leg, Right lower leg, Left lower leg Body parts bathed by helper: Back  Bathing assist Assist Level: Touching or steadying assistance(Pt > 75%)   Set up : To obtain items  Upper Body Dressing/Undressing Upper body dressing   What is the patient wearing?: Pull over shirt/dress, Bra Bra - Perfomed by patient: Thread/unthread right bra strap, Thread/unthread left bra strap, Hook/unhook bra (pull down sports bra)   Pull over shirt/dress - Perfomed by patient: Thread/unthread right sleeve, Thread/unthread left sleeve, Put head through opening, Pull shirt over trunk Pull over shirt/dress - Perfomed by helper: Thread/unthread right sleeve, Thread/unthread left sleeve Button up shirt - Perfomed by patient: Pull shirt around back, Button/unbutton shirt Button up shirt - Perfomed by helper: Thread/unthread right sleeve, Thread/unthread left sleeve    Upper body assist Assist Level: Touching or steadying assistance(Pt > 75%)(in standing)      Lower Body Dressing/Undressing Lower body dressing   What is the patient wearing?: Non-skid slipper socks, Pants Underwear - Performed by patient: Thread/unthread right underwear leg, Thread/unthread left underwear leg, Pull underwear up/down Underwear - Performed by helper: Pull underwear up/down Pants- Performed by patient: Thread/unthread right pants leg, Thread/unthread left pants leg, Pull pants up/down Pants- Performed by helper: Pull  pants up/down Non-skid slipper socks- Performed by patient:  Don/doff right sock, Don/doff left sock                    Lower body assist Assist for lower body dressing: Touching or steadying assistance (Pt > 75%)      Toileting Toileting Toileting activity did not occur: Refused Toileting steps completed by patient: Adjust clothing prior to toileting, Performs perineal hygiene Toileting steps completed by helper: Adjust clothing after toileting Toileting Assistive Devices: Grab bar or rail  Toileting assist Assist level: Touching or steadying assistance (Pt.75%)   Transfers Chair/bed transfer   Chair/bed transfer method: Ambulatory Chair/bed transfer assist level: Supervision or verbal cues Chair/bed transfer assistive device: Armrests     Locomotion Ambulation     Max distance: 330 Assist level: Supervision or verbal cues   Wheelchair   Type: Manual Max wheelchair distance: 50 Assist Level: Touching or steadying assistance (Pt > 75%)  Cognition Comprehension Comprehension assist level: Understands basic 50 - 74% of the time/ requires cueing 25 - 49% of the time  Expression Expression assist level: Expresses basic 50 - 74% of the time/requires cueing 25 - 49% of the time. Needs to repeat parts of sentences.  Social Interaction Social Interaction assist level: Interacts appropriately 50 - 74% of the time - May be physically or verbally inappropriate.  Problem Solving Problem solving assist level: Solves basic less than 25% of the time - needs direction nearly all the time or does not effectively solve problems and may need a restraint for safety  Memory Memory assist level: Recognizes or recalls less than 25% of the time/requires cueing greater than 75% of the time   Medical Problem List and Plan: 1.Functional and cognitive deficitssecondary to TBI -continue therapies -RLAS  V+, poor awareness of deficits, team conference today 2. DVT Prophylaxis/Anticoagulation: Patient is  ambulatory 3.Headaches/Pain Management:tylenol prn 4. Mood:currently has no awareness of accident or deficits. LCSW to follow for now and complete evaluation when appropriate 5. Neuropsych: This patientis notcapable of making decisions on herown behalf. -continue HS seroquel at HS   -continue trial propranolol for h/a and impulsivity.  Not sure how much  further we can titrate this given current blood pressure and heart rate  -sleep chart    -Methylphenidate and reduced to 5 mg due to perceived overstimulation. Resume 10mg  today -continue enclosure bed for patient safety  6. Skin/Wound Care:routine pressure relief measures. 7. Fluids/Electrolytes/Nutrition: Continue to encourage p.o. Intake  8. ABLA: resolving.   -hgb up to 13.9   10. Optho: mydriasis right pupil, left CN III palsy-stable   -consvt care, patching recommended by optho   -outpt neuro-eye follow up at discharge depending upon recovery        LOS (Days) 14 A FACE TO [place] T, MD 07/17/2017 9:11 AM
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
## 2017-07-17 NOTE — Progress Notes (Signed)
Attempt to slip off lap belt and walk to bathroom. Caught in time from nursing station

## 2017-07-17 NOTE — Progress Notes (Signed)
Physical Therapy Session Note  Patient Details  Name: Ebony Jones MRN: [id number] Date of Birth: 1970-10-17  Today's Date: 07/17/2017 PT Individual Time: 4481-8563 PT Individual Time Calculation (min): 40 min   Short Term Goals: Week 2:  PT Short Term Goal 1 (Week 2): pt will transfer with supervision consistently PT Short Term Goal 2 (Week 2): pt will perform gait without AD x 150' with supervision 3/4 trials PT Short Term Goal 3 (Week 2): pt will negotiate 12 steps 1 rail with supervision PT Short Term Goal 4 (Week 2): pt will attend to R during ambulation on her hall, to find her room without cues  Skilled Therapeutic Interventions/Progress Updates:   Pt supine upon arrival and agreeable to therapy, no c/o pain. Worked on endurance w/ OOB mobility this session. Pt ambulated around unit in >150' bouts w/ supervision and required minimal verbal cues for safety and environmental awareness. Performed 10 minutes on NuStep @ L2 for endurance and frequent cues to attend to task. Pt perseverating on going home and stating "I don't know why I am like this". Max cues to reorient to situation and place. She toileted 2x throughout session despite reminding her that she had already gone minutes prior. Verbal cues to use memory strategies such as notebook to remember tasks performed, date, and situation. Ended session sitting in recliner @ nurses station to prepare for dinner, all needs met and quick release belt donned.   Therapy Documentation Precautions:  Precautions Precautions: Fall Precaution Comments: TBI; visual deficits, very impulsive Restrictions Weight Bearing Restrictions: No Vital Signs: Therapy Vitals Temp: (!) 97.5 F (36.4 C) Temp Source: Oral Resp: 16 BP: 118/71 Patient Position (if appropriate): Sitting Oxygen Therapy SpO2: 100 % O2 Device: Not Delivered  See Function Navigator for Current Functional Status.   Therapy/Group: Individual Therapy  Ammanda Dobbins K  Arnette 07/17/2017, 5:11 PM

## 2017-07-17 NOTE — Progress Notes (Signed)
Occupational Therapy Session Note  Patient Details  Name: Ebony Jones MRN: [id number] Date of Birth: [birth year]  Today's Date: 07/17/2017 OT Individual Time: 7425-9563 OT Individual Time Calculation (min): 28 min  30 missed minutes secondary to refusal and pt reports fatigue  Short Term Goals: Week 2:  OT Short Term Goal 1 (Week 2): Pt will locate items on R using compensatory strategies with mod multimodal cues.  OT Short Term Goal 2 (Week 2): Pt will be orientated x 4 with mod multimodal cues. OT Short Term Goal 3 (Week 2): Pt will perform clothing management and hygiene after toileting with close supervision for balance and min cues for safety awareness.   Skilled Therapeutic Interventions/Progress Updates:    Upon entering the room, pt in enclosure bed with no c/o pain this session. Pt remaining supine in bed during orientation with mod multimodal cues to orient pt to situation. Pt correctly answered to self, location, and time. Pt verbalizing , " I need to go to sleep. Leave me alone. Just let me sleep." Multiple times as therapist attempting to encourage pt out of bed. OT assisted pt with sitting up and pt promptly laying back down and declined OOB activities this session and closing eyes while talking to therapist. OT secured enclosure bed and pt sleeping soundly upon therapist exit. 30 missed minutes of OT intervention.   Therapy Documentation Precautions:  Precautions Precautions: Fall Precaution Comments: TBI; visual deficits, very impulsive Restrictions Weight Bearing Restrictions: No General: General OT Amount of Missed Time: 32 Minutes Vital Signs: Therapy Vitals Temp: 97.8 F (36.6 C) Temp Source: Oral Pulse Rate: (!) 58 Resp: 16 BP: 107/62 Patient Position (if appropriate): Lying Oxygen Therapy SpO2: 97 % O2 Device: Not Delivered Pain:   ADL: ADL ADL Comments: see functional naviagtor  See Function Navigator for Current Functional  Status.   Therapy/Group: Individual Therapy  Gypsy Decant 07/17/2017, 8:45 AM

## 2017-07-17 NOTE — Progress Notes (Signed)
Speech Language Pathology Daily Session Note  Patient Details  Name: Ebony Jones MRN: [id number] Date of Birth: [birth year]  Today's Date: 07/17/2017 SLP Individual Time: 0915-1010 SLP Individual Time Calculation (min): 55 min  Short Term Goals: Week 2: SLP Short Term Goal 1 (Week 2): Patient will demonstrate sustained attention to task for ~5 minutes with Mod A verbal cues for redirection.  SLP Short Term Goal 2 (Week 2): Patient will tolerate regular texture diet with efficient mastication without overt s/s of aspiration and supervision cues for swallow safety.  SLP Short Term Goal 3 (Week 2): Patient will demonstrate functional problem solving for basic and familair tasks with Mod A multimodal cues.  SLP Short Term Goal 4 (Week 2): Patient will verbalize wants/needs within a familair context with Mod A multimodal cues.  SLP Short Term Goal 5 (Week 2): Patient will utilize external memory aids to recall basic, daily information with Mod A verbal and question cues. SLP Short Term Goal 6 (Week 2): Patient will consume trials of thin liquids via straw without overt s/s of aspiration over 2 sessions prior to advancement.   Skilled Therapeutic Interventions: Skilled treatment session focused on cognitive and dysphagia goals. SLP facilitated session by administering the MoCA (version 7.1). Patient scored 22/30 points with a score of 16 or above considered normal. Patient demonstrated deficits in short-term recall, abstract reasoning and visual-spatial skills. SLP also initiated a memory notebook to maximize recall of functional information in which patient recorded information with Min A verbal cues. Patient also consumed trials of thin liquids via straw without overt s/s of aspiration, therefore, recommend patient allowed straws with thin liquids Patient left supine in bed with all needs within reach. Continue with current plan of care.      Function:   Cognition Comprehension Comprehension  assist level: Understands basic 75 - 89% of the time/ requires cueing 10 - 24% of the time  Expression   Expression assist level: Expresses basic 90% of the time/requires cueing < 10% of the time.  Social Interaction Social Interaction assist level: Interacts appropriately 75 - 89% of the time - Needs redirection for appropriate language or to initiate interaction.  Problem Solving Problem solving assist level: Solves basic 75 - 89% of the time/requires cueing 10 - 24% of the time  Memory Memory assist level: Recognizes or recalls 25 - 49% of the time/requires cueing 50 - 75% of the time    Pain Pain Assessment Pain Assessment: 0-10 Pain Score: 0-No pain Faces Pain Scale: No hurt  Therapy/Group: Individual Therapy  Ebony Jones 07/17/2017, 12:20 PM

## 2017-07-18 ENCOUNTER — Other Ambulatory Visit: Payer: Self-pay

## 2017-07-18 ENCOUNTER — Encounter (HOSPITAL_COMMUNITY): Payer: Medicaid Other | Admitting: Psychology

## 2017-07-18 ENCOUNTER — Inpatient Hospital Stay (HOSPITAL_COMMUNITY): Payer: Medicaid Other | Admitting: Physical Therapy

## 2017-07-18 ENCOUNTER — Inpatient Hospital Stay (HOSPITAL_COMMUNITY): Payer: Medicaid Other | Admitting: Speech Pathology

## 2017-07-18 ENCOUNTER — Inpatient Hospital Stay (HOSPITAL_COMMUNITY): Payer: Medicaid Other

## 2017-07-18 ENCOUNTER — Inpatient Hospital Stay (HOSPITAL_COMMUNITY): Payer: Medicaid Other | Admitting: Occupational Therapy

## 2017-07-18 DIAGNOSIS — S069X9D Unspecified intracranial injury with loss of consciousness of unspecified duration, subsequent encounter: Secondary | ICD-10-CM

## 2017-07-18 MED ORDER — PROPRANOLOL HCL 20 MG PO TABS
20.0000 mg | ORAL_TABLET | Freq: Three times a day (TID) | ORAL | Status: DC
  Administered 2017-07-18 – 2017-07-25 (×19): 20 mg via ORAL
  Filled 2017-07-18 (×19): qty 1

## 2017-07-18 NOTE — Progress Notes (Signed)
Physical Therapy Session Note  Patient Details  Name: Ebony Jones MRN: [id number] Date of Birth: 20-Apr-1971  Today's Date: 07/18/2017 PT Individual Time: 1515-1600 PT Individual Time Calculation (min): 45 min   Short Term Goals: Week 2:  PT Short Term Goal 1 (Week 2): pt will transfer with supervision consistently PT Short Term Goal 2 (Week 2): pt will perform gait without AD x 150' with supervision 3/4 trials PT Short Term Goal 3 (Week 2): pt will negotiate 12 steps 1 rail with supervision PT Short Term Goal 4 (Week 2): pt will attend to R during ambulation on her hall, to find her room without cues  Skilled Therapeutic Interventions/Progress Updates:    Patient in recliner in room.  Stood with S and ambulated to dayroom.  On biodex balance for limits of stability with ant/post and L/R targets with moveable surface and UE support.  Then with catch game for limits of stability with mod verbal cues due to vision.  Patient ambulated to stairwell and negotiated 10 steps with one rail and step to vs step throught sequence and S to minguard A.  Patient in gym worked on Counselling psychologist of cards with binocular vs monocular vision and reading large print.  Noted R eye more difficulty with reading, but can notice colors and individual symbols.  Patient on mat for core strengthenging exercises to include quadruped alternate arm and leg lift with min a for balance, modified plank x 10 sec x 5 reps, supine alt arm and leg lift x 10 sec hold and LE's on ball for trunk rotation, flexion and bridging.  Patient on mat for coordination/balance activities with assist to include tandem gait, side stepping and crossing over.  Gait back to room with S. Up in recliner with quick release belt.  Discussed therapy session to recall activities.  Left with call bell and all needs in reach.   Therapy Documentation Precautions:  Precautions Precautions: Fall Precaution Comments: TBI; visual deficits, very  impulsive Restrictions Weight Bearing Restrictions: No Pain: Pain Assessment Pain Assessment: No/denies pain   See Function Navigator for Current Functional Status.   Therapy/Group: Individual Therapy  Reginia Naas 07/18/2017, 5:00 PM

## 2017-07-18 NOTE — Progress Notes (Signed)
Speech Language Pathology Weekly Progress and Session Note  Patient Details  Name: Sicily Zaragoza MRN: [id number] Date of Birth: 1971-06-06  Beginning of progress report period: July 11, 2017 End of progress report period: July 18, 2017  Today's Date: 07/18/2017 SLP Individual Time: 1350-1440 SLP Individual Time Calculation (min): 50 min  Short Term Goals: Week 2: SLP Short Term Goal 1 (Week 2): Patient will demonstrate sustained attention to task for ~5 minutes with Mod A verbal cues for redirection.  SLP Short Term Goal 1 - Progress (Week 2): Met SLP Short Term Goal 2 (Week 2): Patient will tolerate regular texture diet with efficient mastication without overt s/s of aspiration and supervision cues for swallow safety.  SLP Short Term Goal 2 - Progress (Week 2): Met SLP Short Term Goal 3 (Week 2): Patient will demonstrate functional problem solving for basic and familair tasks with Mod A multimodal cues.  SLP Short Term Goal 3 - Progress (Week 2): Met SLP Short Term Goal 4 (Week 2): Patient will verbalize wants/needs within a familair context with Mod A multimodal cues.  SLP Short Term Goal 4 - Progress (Week 2): Met SLP Short Term Goal 5 (Week 2): Patient will utilize external memory aids to recall basic, daily information with Mod A verbal and question cues. SLP Short Term Goal 5 - Progress (Week 2): Met SLP Short Term Goal 6 (Week 2): Patient will consume trials of thin liquids via straw without overt s/s of aspiration over 2 sessions prior to advancement.  SLP Short Term Goal 6 - Progress (Week 2): Met    New Short Term Goals: Week 3: SLP Short Term Goal 1 (Week 3): Patient will utilize external memory aids to recall daily information with Min A multimodal cues. SLP Short Term Goal 2 (Week 3): Patient will demonstrate functional problem solving for mildly complex tasks with Min A multimodal cues.  SLP Short Term Goal 3 (Week 3): Patient will identify 3 activities that will be  unsafe at discharge with Min A multimodal cues.  SLP Short Term Goal 4 (Week 3): Patient will alternate attention between 2 tasks for 30 minutes with Min A verbal cues for redirection.   Weekly Progress Updates: Patient has made excellent gains and has met 6 of 6 STG's this reporting period. Currently, patient demonstrates behaviors consistent with a Rancho Level VI-VII and requires overall Min-Mod A multimodal cues to complete functional and familiar tasks safely in regards to anticipatory awareness, recall with use of external aids and mildly complex problem solving. Patient demonstrates decreased verbal perseveration and language of confusion has cleared. Patient is currently consuming regular textures with thin liquids without overt s/s of aspiration and is Mod I for use of swallowing compensatory strategies. Patient and family education is ongoing. Patient would benefit from continued skilled SLP intervention to maximize her cognitive function and overall functional independence prior to discharge home.      Intensity: Minumum of 1-2 x/day, 30 to 90 minutes Frequency: 1 to 3 out of 7 days Duration/Length of Stay: 07/27/17  Treatment/Interventions: Cognitive remediation/compensation;Environmental controls;Internal/external aids;Speech/Language facilitation;Cueing hierarchy;Functional tasks;Patient/family education;Therapeutic Activities   Daily Session  Skilled Therapeutic Interventions: Skilled treatment session focused on cognitive goals. SLP facilitated session by providing Min A verbal cues for problem solving and recall during a mildly complex, novel card task. Patient left upright in recliner with all needs within reach and quick release belt in place. Continue with current plan of care.       Function:   Cognition  Comprehension Comprehension assist level: Understands basic 75 - 89% of the time/ requires cueing 10 - 24% of the time  Expression   Expression assist level: Expresses  basic 90% of the time/requires cueing < 10% of the time.  Social Interaction Social Interaction assist level: Interacts appropriately 75 - 89% of the time - Needs redirection for appropriate language or to initiate interaction.  Problem Solving Problem solving assist level: Solves basic 75 - 89% of the time/requires cueing 10 - 24% of the time  Memory Memory assist level: Recognizes or recalls 50 - 74% of the time/requires cueing 25 - 49% of the time   Pain No/Denies Pain   Therapy/Group: Individual Therapy  Cali Cuartas, [place] 07/18/2017, 3:14 PM
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
## 2017-07-18 NOTE — Progress Notes (Signed)
Occupational Therapy Session Note  Patient Details  Name: Ebony Jones MRN: [id number] Date of Birth: Dec 25, 1970  Today's Date: 07/18/2017 OT Individual Time: 1101-1200 OT Individual Time Calculation (min): 59 min    Short Term Goals: Week 2:  OT Short Term Goal 1 (Week 2): Pt will locate items on R using compensatory strategies with mod multimodal cues.  OT Short Term Goal 2 (Week 2): Pt will be orientated x 4 with mod multimodal cues. OT Short Term Goal 3 (Week 2): Pt will perform clothing management and hygiene after toileting with close supervision for balance and min cues for safety awareness.  Skilled Therapeutic Interventions/Progress Updates:    Upon entering the room, pt seated in recliner chair upon entering the room. Pt with no c/o pain this session. Pt ambulating to bathroom for toileting without use of AD at distant supervision level and performed toileting, hygiene, and clothing management with supervision as well. Pt assisting therapist with cleaning room to create safer environment and decrease fall risk with overall supervision and min cues for safety. Pt requesting to make coffee and ambulating to family room with verbal cues for navigation to locate. Pt able to locate all items needed with increased time. Pt ambulating while holding onto coffee without spillage. Pt returning to recliner chair at end of session with quick release belt donned and call bell within reach.   Therapy Documentation Precautions:  Precautions Precautions: Fall Precaution Comments: TBI; visual deficits, very impulsive Restrictions Weight Bearing Restrictions: No General:   Vital Signs: Therapy Vitals Temp: 98.6 F (37 C) Temp Source: Oral Pulse Rate: 85 Resp: 16 BP: 106/78 Patient Position (if appropriate): Sitting Oxygen Therapy SpO2: 100 % O2 Device: Not Delivered Pain: Pain Assessment Pain Assessment: No/denies pain ADL: ADL ADL Comments: see functional naviagtor Vision    Perception    Praxis   Exercises:   Other Treatments:    See Function Navigator for Current Functional Status.   Therapy/Group: Individual Therapy  Gypsy Decant 07/18/2017, 5:21 PM

## 2017-07-18 NOTE — Progress Notes (Signed)
Speech Language Pathology Daily Session Note  Patient Details  Name: Ebony Jones MRN: [id number] Date of Birth: 08-12-1970  Today's Date: 07/18/2017  Session 1: SLP Individual Time: 0915-1000 SLP Individual Time Calculation (min): 45 min   Short Term Goals: Week 2: SLP Short Term Goal 1 (Week 2): Patient will demonstrate sustained attention to task for ~5 minutes with Mod A verbal cues for redirection.  SLP Short Term Goal 2 (Week 2): Patient will tolerate regular texture diet with efficient mastication without overt s/s of aspiration and supervision cues for swallow safety.  SLP Short Term Goal 3 (Week 2): Patient will demonstrate functional problem solving for basic and familair tasks with Mod A multimodal cues.  SLP Short Term Goal 4 (Week 2): Patient will verbalize wants/needs within a familair context with Mod A multimodal cues.  SLP Short Term Goal 5 (Week 2): Patient will utilize external memory aids to recall basic, daily information with Mod A verbal and question cues. SLP Short Term Goal 6 (Week 2): Patient will consume trials of thin liquids via straw without overt s/s of aspiration over 2 sessions prior to advancement.   Skilled Therapeutic Interventions: Skilled treatment session focused on cognitive goals. SLP facilitated session by providing overall Mod A verbal cues for patient to utilize external memory aids to recall/navigate to specific places and people and for visual attention in a moderately distracting enviornment. Patient also required Min-Mod A verbal cues for problem solving during a basic money management task. Patient left upright in recliner with all needs within reach. Continue with current plan of care.   Function:   Cognition Comprehension Comprehension assist level: Understands basic 75 - 89% of the time/ requires cueing 10 - 24% of the time  Expression   Expression assist level: Expresses basic 90% of the time/requires cueing < 10% of the time.  Social  Interaction Social Interaction assist level: Interacts appropriately 75 - 89% of the time - Needs redirection for appropriate language or to initiate interaction.  Problem Solving Problem solving assist level: Solves basic 75 - 89% of the time/requires cueing 10 - 24% of the time  Memory Memory assist level: Recognizes or recalls 25 - 49% of the time/requires cueing 50 - 75% of the time    Pain No/Denies Pain   Therapy/Group: Individual Therapy  Ebony Jones 07/18/2017, 12:44 PM

## 2017-07-18 NOTE — Progress Notes (Signed)
[facility]ILIT[facility]     PROGRESS NOTE    Subjective/Complaints: Slept well.  Denies headache or pain.  "I am ready to go home".  ROS: Limited due to cognitive/behavioral    Objective: Vital Signs: Blood pressure 97/61, pulse 72, temperature 98 F (36.7 C), temperature source Oral, resp. rate 16, height 5\' 7"  (1.702 m), weight 66.5 kg (146 lb 9.7 oz), SpO2 97 %. No results found. No results for input(s): WBC, HGB, HCT, PLT in the last 72 hours. Recent Labs    07/17/17 0512  NA 136  K 3.8  CL 101  GLUCOSE 78  BUN 9  CREATININE 0.66  CALCIUM 9.1   CBG (last 3)  No results for input(s): GLUCAP in the last 72 hours.  Wt Readings from Last 3 Encounters:  07/03/17 66.5 kg (146 lb 9.7 oz)  07/03/17 66.5 kg (146 lb 9.7 oz)  03/28/17 68 kg (150 lb)    Physical Exam:  Constitutional: She appearswell-developedand well-nourished.No distress.in enclosure bed HENT:  Head:Normocephalicand atraumatic.  Eyes: Mydriasis right eye.  Neck:Normal range of motion.Neck supple.  Cardiovascular:RRR without murmur. No JVD  .  Respiratory:CTA Bilaterally without wheezes or rales. Normal effort     UT:MLYY.Bowel sounds are normal. She exhibitsno distension. There isno tenderness.  Musculoskeletal: She exhibits noedemaor tenderness.  Neurological: alert.  Acranial nerve deficitis present.    Continued dysconjugate gaze, left eye with decr medial movement--essentially unchanged from prior exams.  Language normal.  Improving attention.  As for memory and limited insight and awareness.  Mes all 4 limbs freely. stable exam Skin: Skin iswarmand dry.  Psychiatric: Less distracted but still impulsive.  Generally pleasant and cooperative however   Assessment/Plan: 1. 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 Physiatrist is providing close team supervision and 24 hour management of active medical problems listed below. Physiatrist and rehab team continue to assess barriers to discharge/monitor patient progress toward functional and medical goals.  Function:  Bathing Bathing position Bathing activity did not occur: Refused Position: Shower  Bathing parts Body parts bathed by patient: Right arm, Left arm, Chest, Abdomen, Front perineal area, Buttocks, Right upper leg, Left upper leg, Right lower leg, Left lower leg Body parts bathed by helper: Back  Bathing assist Assist Level: Supervision or verbal cues   Set up : To obtain items  Upper Body Dressing/Undressing Upper body dressing   What is the patient wearing?: Pull over shirt/dress, Bra Bra - Perfomed by patient: Thread/unthread right bra strap, Thread/unthread left bra strap, Hook/unhook bra (pull down sports bra)   Pull over shirt/dress - Perfomed by patient: Thread/unthread right sleeve, Thread/unthread left sleeve, Put head through opening, Pull shirt over trunk Pull over shirt/dress - Perfomed by helper: Thread/unthread right sleeve, Thread/unthread left sleeve Button up shirt - Perfomed by patient: Pull shirt around back, Button/unbutton shirt, Thread/unthread left sleeve, Thread/unthread right sleeve Button up shirt - Perfomed by helper: Thread/unthread right sleeve, Thread/unthread left sleeve    Upper body assist Assist Level: Supervision or verbal cues      Lower Body Dressing/Undressing Lower body dressing   What is the patient wearing?: Non-skid slipper socks, Pants Underwear - Performed by patient: Thread/unthread right underwear leg, Thread/unthread left underwear leg, Pull underwear up/down Underwear - Performed by helper: Pull underwear up/down Pants- Performed by patient: Thread/unthread right pants leg, Thread/unthread left pants leg, Pull pants up/down Pants- Performed  by helper: Pull pants up/down Non-skid  slipper socks- Performed by patient: Don/doff right sock, Don/doff left sock                    Lower body assist Assist for lower body dressing: Supervision or verbal cues      Toileting Toileting Toileting activity did not occur: Refused Toileting steps completed by patient: Adjust clothing prior to toileting, Performs perineal hygiene, Adjust clothing after toileting Toileting steps completed by helper: Adjust clothing after toileting Toileting Assistive Devices: Grab bar or rail  Toileting assist Assist level: Supervision or verbal cues   Transfers Chair/bed transfer   Chair/bed transfer method: Ambulatory Chair/bed transfer assist level: Supervision or verbal cues Chair/bed transfer assistive device: Armrests     Locomotion Ambulation     Max distance: 150' Assist level: Supervision or verbal cues   Wheelchair   Type: Manual Max wheelchair distance: 50 Assist Level: Touching or steadying assistance (Pt > 75%)  Cognition Comprehension Comprehension assist level: Understands basic 75 - 89% of the time/ requires cueing 10 - 24% of the time  Expression Expression assist level: Expresses basic 90% of the time/requires cueing < 10% of the time.  Social Interaction Social Interaction assist level: Interacts appropriately 75 - 89% of the time - Needs redirection for appropriate language or to initiate interaction.  Problem Solving Problem solving assist level: Solves basic 75 - 89% of the time/requires cueing 10 - 24% of the time  Memory Memory assist level: Recognizes or recalls 25 - 49% of the time/requires cueing 50 - 75% of the time   Medical Problem List and Plan: 1.Functional and cognitive deficitssecondary to TBI -continue therapies -RLAS  V+.  Making progress but still with substantial cognitive deficits 2. DVT Prophylaxis/Anticoagulation: Patient is ambulatory 3.Headaches/Pain Management:tylenol prn 4. Mood:currently has no  awareness of accident or deficits. LCSW to follow for now and complete evaluation when appropriate 5. Neuropsych: This patientis notcapable of making decisions on herown behalf. -continue HS seroquel at HS   -Seroquel effective for headache.  Perhaps helping somewhat with impulsivity   -sleep chart    -Methylphenidate increased to 10 mg twice daily -continue enclosure bed for patient safety.  Will unzip today and observe for how she tolerates that 6. Skin/Wound Care:routine pressure relief measures. 7. Fluids/Electrolytes/Nutrition: Continue to encourage p.o. Intake  8. ABLA: resolving.   -hgb up to 13.9   10. Optho: mydriasis right pupil, left CN III palsy-stable   -consvt care, patching recommended by optho   -outpt neuro-eye follow up at discharge depending upon recovery   -No dramatic changes at this point     LOS (Days) 15 A FACE TO FACE EVALUATION WAS PERFORMED  Meredith Staggers, MD 07/18/2017 8:53 AM

## 2017-07-19 ENCOUNTER — Inpatient Hospital Stay (HOSPITAL_COMMUNITY): Payer: Medicaid Other | Admitting: Speech Pathology

## 2017-07-19 ENCOUNTER — Inpatient Hospital Stay (HOSPITAL_COMMUNITY): Payer: Medicaid Other | Admitting: Occupational Therapy

## 2017-07-19 ENCOUNTER — Inpatient Hospital Stay (HOSPITAL_COMMUNITY): Payer: Self-pay

## 2017-07-19 NOTE — Progress Notes (Signed)
Occupational Therapy Session Note  Patient Details  Name: Ebony Jones MRN: [id number] Date of Birth: 1971/04/28  Today's Date: 07/19/2017 OT Individual Time: 6269-4854 and 1300-1356 OT Individual Time Calculation (min): 54 min and 56 min    Short Term Goals: Week 2:  OT Short Term Goal 1 (Week 2): Pt will locate items on R using compensatory strategies with mod multimodal cues.  OT Short Term Goal 2 (Week 2): Pt will be orientated x 4 with mod multimodal cues. OT Short Term Goal 3 (Week 2): Pt will perform clothing management and hygiene after toileting with close supervision for balance and min cues for safety awareness.   Skilled Therapeutic Interventions/Progress Updates:    Session 1: Upon entering the room, pt stating, " I need to get ready. Just look at my hair." Pt ambulating in room to obtain needed items to shower and dress with overall supervision. Pt operated shower and stood to wash without LOB at supervision level. Pt cleaning shower space and picking up all dirty linens and clothing and placing into correct place without cues to do so. Pt ambulating to dresser to obtain all clothing needed with min verbal guidance cues this session to select all needed items. Pt donned clothing with sit <>stand from toilet seat at super vision level. Pt standing at sink for grooming tasks and to dry hair without LOB. Pt returning to sit in recliner chair at end of session and awaiting lunch tray. While seated, pt was able to verbalize ingredients and step by step directions for how to make chicken and dumplings. Pt even stating where to locate needed grocery items at store. Pt needing only min verbal guidance to obtain all information needed. Pt remained in wheelchair with quick release belt donned and call bell within reach upon exiting the room.   Session 2: Upon entering the room, pt seated in recliner chair with no c/o pain this session. Pt ambulating into bathroom with distant supervision for  toileting at supervision level. Pt performing hand hygiene and then ambulating to holiday party. Pt attend to task of making christmas card with min verbal cues to attend to task while in very distracting room. Pt ambulates to food table and places items on plate and carry's to table without spillage. Pt ambulating to family room to make coffee with increased time and supervision for task. No cues needed to locate items,sequence, or initiate. Pt returning to room at end of session, pt seated in recliner chair with quick release belt donned and call bell within reach.   Therapy Documentation Precautions:  Precautions Precautions: Fall Precaution Comments: TBI; visual deficits, very impulsive Restrictions Weight Bearing Restrictions: No General:   Vital Signs: Therapy Vitals Pulse Rate: 62 BP: 110/60 Pain:   ADL: ADL ADL Comments: see functional naviagtor Vision   Perception    Praxis   Exercises:   Other Treatments:    See Function Navigator for Current Functional Status.   Therapy/Group: Individual Therapy  Gypsy Decant 07/19/2017, 12:32 PM

## 2017-07-19 NOTE — Progress Notes (Signed)
Speech Language Pathology Daily Session Note  Patient Details  Name: Ebony Jones MRN: [id number] Date of Birth: 09/30/1970  Today's Date: 07/19/2017 SLP Individual Time: 0900-1000 SLP Individual Time Calculation (min): 60 min  Short Term Goals: Week 3: SLP Short Term Goal 1 (Week 3): Patient will utilize external memory aids to recall daily information with Min A multimodal cues. SLP Short Term Goal 2 (Week 3): Patient will demonstrate functional problem solving for mildly complex tasks with Min A multimodal cues.  SLP Short Term Goal 3 (Week 3): Patient will identify 3 activities that will be unsafe at discharge with Min A multimodal cues.  SLP Short Term Goal 4 (Week 3): Patient will alternate attention between 2 tasks for 30 minutes with Min A verbal cues for redirection.   Skilled Therapeutic Interventions: Skilled treatment session focused on cognitive goals. SLP facilitated session by providing extra time and Min A verbal cues for problem solving and visual attention during mildly complex kitchen tasks. Patient alternated her attention between conversation and task in a moderate-severely distracting environment with Mod I. Patient left upright in recliner with quick release belt in place. Continue with current plan of care.      Function:  Cognition Comprehension Comprehension assist level: Understands basic 75 - 89% of the time/ requires cueing 10 - 24% of the time  Expression   Expression assist level: Expresses basic 90% of the time/requires cueing < 10% of the time.  Social Interaction Social Interaction assist level: Interacts appropriately 75 - 89% of the time - Needs redirection for appropriate language or to initiate interaction.  Problem Solving Problem solving assist level: Solves basic 75 - 89% of the time/requires cueing 10 - 24% of the time  Memory Memory assist level: Recognizes or recalls 75 - 89% of the time/requires cueing 10 - 24% of the time    Pain No/Denies  Pain   Therapy/Group: Individual Therapy  Omega Durante 07/19/2017, 3:07 PM

## 2017-07-19 NOTE — Progress Notes (Signed)
Physical Therapy Weekly Progress Note  Patient Details  Name: Ebony Jones MRN: [id number] Date of Birth: 09-22-1970  Beginning of progress report period: July 11, 2017 End of progress report period: July 19, 2017  Today's Date: 07/19/2017 PT Individual Time: 6734-1937 PT Individual Time Calculation (min): 72 min   Patient has met 4 of 4 short term goals.  Patient progressing with ambulation, balance, stairs and transfers as well as with environmental awareness and wayfinding to her room.  She continues to have visual complaints and deficits limiting mobility/safety as below.  Patient continues to demonstrate the following deficits decreased visual acuity and decreased visual perceptual skills and decreased awareness, decreased problem solving, decreased safety awareness and decreased memory and therefore will continue to benefit from skilled PT intervention to increase functional independence with mobility.  Patient progressing toward long term goals..  Continue plan of care.  PT Short Term Goals Week 2:  PT Short Term Goal 1 (Week 2): pt will transfer with supervision consistently PT Short Term Goal 1 - Progress (Week 2): Met PT Short Term Goal 2 (Week 2): pt will perform gait without AD x 150' with supervision 3/4 trials PT Short Term Goal 2 - Progress (Week 2): Met PT Short Term Goal 3 (Week 2): pt will negotiate 12 steps 1 rail with supervision PT Short Term Goal 3 - Progress (Week 2): Met PT Short Term Goal 4 (Week 2): pt will attend to R during ambulation on her hall, to find her room without cues PT Short Term Goal 4 - Progress (Week 2): Met Week 3:  PT Short Term Goal 1 (Week 3): STG =LTG due to ELOS  Skilled Therapeutic Interventions/Progress Updates:    Patient in recliner in room stands with S and ambulated over unit and down to gift shop on elevator with S and mod cues for wayfinding using signage in hallway.  Patient required increased time due to visual deficits  and multimodal cues for using map appropriately.  Patient performed coordination/balance and dynamic gait activities walking and tossing/catching beach ball; tandem gait, side stepping, crossing over, kicking bean bags and forward march.  On mat performed core strengthening activities to include LE's on red therapy ball bridging, lateral trunk rotation, trunk flexion, single leg bridge with leg lift, prone modified plank and quadruped alternate arm/leg lift all x 5-10 reps.  Patient ambulated to dayroom and on Nu Step x 7 minutes UE/LE's at level 3.  Back in room to work on vision, pt able to read small print with line by line place holder with R eye closed and increased time.  Educated on why we are not correcting her vision yet due to improvements daily and pt verbalized understanding.  Left in recliner with quick release and needs in reach.   Therapy Documentation Precautions:  Precautions Precautions: Fall Precaution Comments: TBI; visual deficits, very impulsive Restrictions Weight Bearing Restrictions: No Pain:  Pain Assessment Faces Pain Scale: Hurts a little bit Pain Location: Eye Pain Orientation: Right Pain Intervention(s): Rest   See Function Navigator for Current Functional Status.  Therapy/Group: Individual Therapy  Reginia Naas 07/19/2017, 6:55 PM

## 2017-07-19 NOTE — Progress Notes (Signed)
Enclosure bed unzipped and not in use at 0856. Patient up in recliner with quick release belt in tact. Patient remains confused but easily redirected. Patient out of enclosure bed throughout day. Attempted to allow patient time out of quick release belt and instructed to call for assist. Patient noted up in room walking around. Patient educated on safety and calling for assistance. Patient verbalizes she will call for assist but did not. Patient noted with steady ambulation in room. Patient unsafe behavior and not calling for assist enclosure bed to be started per P. Love, PA. Continue with plan of care.  Ebony Jones

## 2017-07-19 NOTE — Progress Notes (Signed)
Physical Therapy Session Note  Patient Details  Name: Katiya Fike MRN: [id number] Date of Birth: Sep 06, 1970  Today's Date: 07/18/2017 PT Individual Time:1630-1700     Short Term Goals: Week 2:  PT Short Term Goal 1 (Week 2): pt will transfer with supervision consistently PT Short Term Goal 2 (Week 2): pt will perform gait without AD x 150' with supervision 3/4 trials PT Short Term Goal 3 (Week 2): pt will negotiate 12 steps 1 rail with supervision PT Short Term Goal 4 (Week 2): pt will attend to R during ambulation on her hall, to find her room without cues  Skilled Therapeutic Interventions/Progress Updates:   Pt received sitting in [facility] and agreeable to PT  PT instructed pt in gait training to [facility] gym with supervision assist. Dynavision balance, and visual scanning exercise completed with supervision assist from PT. Program A x 3 with 3 rings progressing to A with Tscope and 4 rings x 3. Pt noted to have improved results after each trial and also improved problem solving to scan to right as needed. Patient returned to room and left sitting in [facility] with call bell in reach and all needs met.        Therapy Documentation Precautions:  Precautions Precautions: Fall Precaution Comments: TBI; visual deficits, very impulsive Restrictions Weight Bearing Restrictions: No Vital Signs: Therapy Vitals Temp: 97.9 F (36.6 C) Temp Source: Oral Pulse Rate: 64 Resp: 16 BP: 95/60 Patient Position (if appropriate): Lying Oxygen Therapy SpO2: 100 % O2 Device: Not Delivered Pain:   0/10    Other Treatments:     See Function Navigator for Current Functional Status.   Therapy/Group: Individual Therapy  Lorie Phenix 07/19/2017, 5:58 AM

## 2017-07-19 NOTE — Progress Notes (Signed)
[place] PHYSICAL MEDICINE & REHABILITATION     PROGRESS NOTE    Subjective/Complaints: No new complaints. Lying comfortably in bed  ROS: Limited due to cognitive/behavioral   Objective: Vital Signs: Blood pressure 95/60, pulse 64, temperature 97.9 F (36.6 C), temperature source Oral, resp. rate 16, height 5\' 7"  (1.702 m), weight 66.5 kg (146 lb 9.7 oz), SpO2 100 %. No results found. No results for input(s): WBC, HGB, HCT, PLT in the last 72 hours. Recent Labs    07/17/17 0512  NA 136  K 3.8  CL 101  GLUCOSE 78  BUN 9  CREATININE 0.66  CALCIUM 9.1   CBG (last 3)  No results for input(s): GLUCAP in the last 72 hours.  Wt Readings from Last 3 Encounters:  07/03/17 66.5 kg (146 lb 9.7 oz)  07/03/17 66.5 kg (146 lb 9.7 oz)  03/28/17 68 kg (150 lb)    Physical Exam:  Constitutional: She appearswell-developedand well-nourished.No distress.in enclosure bed HENT:  Head:Normocephalicand atraumatic.  Eyes: Mydriasis right eye.  Neck:Normal range of motion.Neck supple.  Cardiovascular:RRR without murmur. No JVD   .  Respiratory:CTA Bilaterally without wheezes or rales. Normal effort     ZS:WFUX.Bowel sounds are normal. She exhibitsno distension. There isno tenderness.  Musculoskeletal: She exhibits noedemaor tenderness.  Neurological: alert.  Acranial nerve deficitis present.    Continued dysconjugate gaze, left eye with decr medial movement--essentially unchanged from prior exams.  Language normal.  Improving attention.  As for memory and limited insight and awareness.  Mes all 4 limbs freely. stable exam Skin: Skin iswarmand dry.  Psychiatric: Remains impulsive.  Generally pleasant and cooperative however   Assessment/Plan: 1. 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 Physiatrist is providing close team supervision  and 24 hour management of active medical problems listed below. Physiatrist and rehab team continue to assess barriers to discharge/monitor patient progress toward functional and medical goals.  Function:  Bathing Bathing position Bathing activity did not occur: Refused Position: Shower  Bathing parts Body parts bathed by patient: Right arm, Left arm, Chest, Abdomen, Front perineal area, Buttocks, Right upper leg, Left upper leg, Right lower leg, Left lower leg Body parts bathed by helper: Back  Bathing assist Assist Level: Supervision or verbal cues   Set up : To obtain items  Upper Body Dressing/Undressing Upper body dressing   What is the patient wearing?: Pull over shirt/dress, Bra Bra - Perfomed by patient: Thread/unthread right bra strap, Thread/unthread left bra strap, Hook/unhook bra (pull down sports bra)   Pull over shirt/dress - Perfomed by patient: Thread/unthread right sleeve, Thread/unthread left sleeve, Put head through opening, Pull shirt over trunk Pull over shirt/dress - Perfomed by helper: Thread/unthread right sleeve, Thread/unthread left sleeve Button up shirt - Perfomed by patient: Pull shirt around back, Button/unbutton shirt, Thread/unthread left sleeve, Thread/unthread right sleeve Button up shirt - Perfomed by helper: Thread/unthread right sleeve, Thread/unthread left sleeve    Upper body assist Assist Level: Supervision or verbal cues      Lower Body Dressing/Undressing Lower body dressing   What is the patient wearing?: Non-skid slipper socks, Pants Underwear - Performed by patient: Thread/unthread right underwear leg, Thread/unthread left underwear leg, Pull underwear up/down Underwear - Performed by helper: Pull underwear up/down Pants- Performed by patient: Thread/unthread right pants leg, Thread/unthread left pants leg, Pull pants up/down Pants- Performed by helper: Pull pants up/down Non-skid slipper socks- Performed by  patient: Don/doff right sock,  Don/doff left sock                    Lower body assist Assist for lower body dressing: Supervision or verbal cues      Toileting Toileting Toileting activity did not occur: Refused Toileting steps completed by patient: (P) Adjust clothing prior to toileting, Performs perineal hygiene, Adjust clothing after toileting Toileting steps completed by helper: Adjust clothing after toileting Toileting Assistive Devices: Grab bar or rail  Toileting assist Assist level: Supervision or verbal cues   Transfers Chair/bed transfer   Chair/bed transfer method: Ambulatory Chair/bed transfer assist level: Supervision or verbal cues Chair/bed transfer assistive device: Armrests     Locomotion Ambulation     Max distance: 150' Assist level: Supervision or verbal cues   Wheelchair   Type: Manual Max wheelchair distance: 50 Assist Level: Touching or steadying assistance (Pt > 75%)  Cognition Comprehension Comprehension assist level: Understands basic 75 - 89% of the time/ requires cueing 10 - 24% of the time  Expression Expression assist level: Expresses basic 90% of the time/requires cueing < 10% of the time.  Social Interaction Social Interaction assist level: Interacts appropriately 75 - 89% of the time - Needs redirection for appropriate language or to initiate interaction.  Problem Solving Problem solving assist level: Solves basic 75 - 89% of the time/requires cueing 10 - 24% of the time  Memory Memory assist level: Recognizes or recalls 50 - 74% of the time/requires cueing 25 - 49% of the time   Medical Problem List and Plan: 1.Functional and cognitive deficitssecondary to TBI -continue therapies -RLAS  V+.    2. DVT Prophylaxis/Anticoagulation: Patient is ambulatory 3.Headaches/Pain Management:tylenol prn 4. Mood:currently has no awareness of accident or deficits. LCSW to follow for now and complete evaluation when appropriate 5. Neuropsych: This  patientis notcapable of making decisions on herown behalf. -continue HS seroquel at HS   -Seroquel effective for headache.  Perhaps helping somewhat with impulsivity   -sleep chart    -Methylphenidate increased to 10 mg twice daily -continue enclosure bed for patient safety.  Continue unzipping bed during the day 6. Skin/Wound Care:routine pressure relief measures. 7. Fluids/Electrolytes/Nutrition: Continue to encourage p.o. Intake  8. ABLA: resolving.    -hgb up to 13.9   10. Optho: mydriasis right pupil, left CN III palsy-stable   -consvt care, patching recommended by optho   -outpt neuro-eye follow up at discharge depending upon recovery         LOS (Days) Zena T, MD 07/19/2017 9:00 AM

## 2017-07-20 ENCOUNTER — Inpatient Hospital Stay (HOSPITAL_COMMUNITY): Payer: Self-pay

## 2017-07-20 ENCOUNTER — Inpatient Hospital Stay (HOSPITAL_COMMUNITY): Payer: Self-pay | Admitting: Occupational Therapy

## 2017-07-20 ENCOUNTER — Inpatient Hospital Stay (HOSPITAL_COMMUNITY): Payer: Medicaid Other | Admitting: Speech Pathology

## 2017-07-20 NOTE — Progress Notes (Addendum)
[place] PHYSICAL MEDICINE & REHABILITATION     PROGRESS NOTE    Subjective/Complaints: Had a good night sleep.  Denies headache.  Still anxious to get home.  ROS: pt denies nausea, vomiting, diarrhea, cough, shortness of breath or chest pain   Objective: Vital Signs: Blood pressure 112/67, pulse (!) 55, temperature 97.8 F (36.6 C), temperature source Oral, resp. rate 14, height 5\' 7"  (1.702 m), weight 66.5 kg (146 lb 9.7 oz), SpO2 100 %. No results found. No results for input(s): WBC, HGB, HCT, PLT in the last 72 hours. No results for input(s): NA, K, CL, GLUCOSE, BUN, CREATININE, CALCIUM in the last 72 hours.  Invalid input(s): CO CBG (last 3)  No results for input(s): GLUCAP in the last 72 hours.  Wt Readings from Last 3 Encounters:  07/03/17 66.5 kg (146 lb 9.7 oz)  07/03/17 66.5 kg (146 lb 9.7 oz)  03/28/17 68 kg (150 lb)    Physical Exam:  Constitutional: She appearswell-developedand well-nourished.No distress.in enclosure bed HENT:  Head:Normocephalicand atraumatic.  Eyes: Mydriasis right eye.  Neck:Normal range of motion.Neck supple.  Cardiovascular: RRR without murmur. No JVD    .  Respiratory:CTA Bilaterally without wheezes or rales. Normal effort      QB:HALP.Bowel sounds are normal. She exhibitsno distension. There isno tenderness.  Musculoskeletal: She exhibits noedemaor tenderness.  Neurological: alert.  Acranial nerve deficitis present.    Continued dysconjugate gaze, left eye with decr medial movement--essentially unchanged from prior exams.  Language normal.  Improving attention.  As for memory and limited insight and awareness.  Mes all 4 limbs freely. stable exam Skin: Skin iswarmand dry.  Psychiatric: Impulsive but redirectable   Assessment/Plan: 1. 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 Physiatrist is providing close team supervision and 24 hour management of active medical problems listed below. Physiatrist and rehab team continue to assess barriers to discharge/monitor patient progress toward functional and medical goals.  Function:  Bathing Bathing position Bathing activity did not occur: Refused Position: Shower  Bathing parts Body parts bathed by patient: Right arm, Left arm, Chest, Abdomen, Front perineal area, Buttocks, Right upper leg, Left upper leg, Right lower leg, Left lower leg Body parts bathed by helper: Back  Bathing assist Assist Level: Supervision or verbal cues   Set up : To obtain items  Upper Body Dressing/Undressing Upper body dressing   What is the patient wearing?: Pull over shirt/dress, Bra Bra - Perfomed by patient: Thread/unthread right bra strap, Thread/unthread left bra strap, Hook/unhook bra (pull down sports bra)   Pull over shirt/dress - Perfomed by patient: Thread/unthread right sleeve, Thread/unthread left sleeve, Put head through opening, Pull shirt over trunk Pull over shirt/dress - Perfomed by helper: Thread/unthread right sleeve, Thread/unthread left sleeve Button up shirt - Perfomed by patient: Pull shirt around back, Button/unbutton shirt, Thread/unthread left sleeve, Thread/unthread right sleeve Button up shirt - Perfomed by helper: Thread/unthread right sleeve, Thread/unthread left sleeve    Upper body assist Assist Level: Supervision or verbal cues      Lower Body Dressing/Undressing Lower body dressing   What is the patient wearing?: Non-skid slipper socks, Pants Underwear - Performed by patient: Thread/unthread right underwear leg, Thread/unthread left underwear leg, Pull underwear up/down Underwear - Performed by helper: Pull underwear up/down Pants- Performed by patient: Thread/unthread right pants leg, Thread/unthread left pants leg, Pull pants up/down Pants- Performed by helper: Pull pants up/down Non-skid  slipper socks- Performed by patient: Don/doff right sock, Don/doff left sock                    Lower body assist Assist for lower body dressing: Supervision or verbal cues      Toileting Toileting Toileting activity did not occur: Refused Toileting steps completed by patient: Adjust clothing prior to toileting, Performs perineal hygiene, Adjust clothing after toileting Toileting steps completed by helper: Adjust clothing after toileting Toileting Assistive Devices: Grab bar or rail  Toileting assist Assist level: Supervision or verbal cues   Transfers Chair/bed transfer   Chair/bed transfer method: Ambulatory Chair/bed transfer assist level: Supervision or verbal cues Chair/bed transfer assistive device: Armrests     Locomotion Ambulation     Max distance: 250 Assist level: Supervision or verbal cues   Wheelchair   Type: Manual Max wheelchair distance: 50 Assist Level: Touching or steadying assistance (Pt > 75%)  Cognition Comprehension Comprehension assist level: Understands basic 75 - 89% of the time/ requires cueing 10 - 24% of the time  Expression Expression assist level: Expresses basic 90% of the time/requires cueing < 10% of the time.  Social Interaction Social Interaction assist level: Interacts appropriately 75 - 89% of the time - Needs redirection for appropriate language or to initiate interaction.  Problem Solving Problem solving assist level: Solves basic 75 - 89% of the time/requires cueing 10 - 24% of the time  Memory Memory assist level: Recognizes or recalls 75 - 89% of the time/requires cueing 10 - 24% of the time   Medical Problem List and Plan: 1.Functional and cognitive deficitssecondary to TBI -continue therapies -RLAS  V+.    2. DVT Prophylaxis/Anticoagulation: Patient is ambulatory 3.Headaches/Pain Management:tylenol prn 4. Mood:currently has no awareness of accident or deficits. LCSW to follow for now and  complete evaluation when appropriate 5. Neuropsych: This patientis notcapable of making decisions on herown behalf. -continue HS seroquel at HS   -inderal effective for headache.  Perhaps helping somewhat with impulsivity too. Dose at 20mg  tid but BP/HR soft---will need to watch closely  -sleep chart    -Methylphenidate increased to 10 mg twice daily -We will discontinue enclosure bed and change to low bed  6. Skin/Wound Care:routine pressure relief measures. 7. Fluids/Electrolytes/Nutrition: Continue to encourage p.o. Intake  8. ABLA: resolving.    -hgb up to 13.9   10. Optho: mydriasis right pupil, left CN III palsy-stable   -consvt care, patching recommended by optho   -outpt neuro-eye follow up at discharge depending upon recovery         LOS (Days) 17 A Pindall T, MD 07/20/2017 9:27 AM

## 2017-07-20 NOTE — Progress Notes (Signed)
Physical Therapy Session Note  Patient Details  Name: Ebony Jones MRN: [id number] Date of Birth: 01/02/1971  Today's Date: 07/20/2017 PT Individual Time: 1245-1415 PT Individual Time Calculation (min): 90 min   Short Term Goals: Week 3:  PT Short Term Goal 1 (Week 3): STG =LTG due to ELOS  Skilled Therapeutic Interventions/Progress Updates:    Patient in recliner in room reports ate lunch already and performed tasks earlier with numbers and counting.  Ambulated to therapy gym with S and performed dynamic gait activities including walking over compliant surfaces with ball toss to self.  Standing still ball toss and catch over shoulders and gait with same over R shoulder first then L shoulder first.  Demonstrated loss of ball more when catching over R shoulder.  Walking with head turns to ID pictures on bean bags with cues for continuous walking.  Demonstrated difficulty on R twisting farther to see items than L.  Negotiated 12 steps with rails and S.  Gait to kitchen for activity to problem solve finding items for mixing and baking with min cues.  Patient performed car transfer with S.  On mat for core strengthening to include LE's on ball for lateral trunk rotation, trunk/hip flexion and extension, bridging with single leg and both legs, modified plank and quadruped alternate arm and leg lifts min A for balance x 5-10 reps of each.  Patient ambuilated to dayroom for seated game for memory including choosing letters to spell parts of phrases to identify [employer]). Gait in dayroom pt tripped on chair leg on R with self recovery. Reports due to decreased vision on that side. On Nu Step level 8 for U/LE endurance training at level 4.  In therapy gym performed standing balance with toe taps to cones with min A and gait through cones tapping toes as walking through.  Patient requesting to use restroom and assist to room S ambulatory and performed toileting activities with S.  Left in recliner with  boyfriend in room.   Therapy Documentation Precautions:  Precautions Precautions: Fall Precaution Comments: TBI; visual deficits, very impulsive Restrictions Weight Bearing Restrictions: No Pain: Pain Assessment Pain Assessment: No/denies pain   See Function Navigator for Current Functional Status.   Therapy/Group: Individual Therapy  Reginia Naas 07/20/2017, 1:13 PM

## 2017-07-20 NOTE — Progress Notes (Signed)
Speech Language Pathology Daily Session Note  Patient Details  Name: Ebony Jones MRN: [id number] Date of Birth: 1971-08-06  Today's Date: 07/20/2017 SLP Individual Time: 0900-1000 SLP Individual Time Calculation (min): 60 min  Short Term Goals: Week 3: SLP Short Term Goal 1 (Week 3): Patient will utilize external memory aids to recall daily information with Min A multimodal cues. SLP Short Term Goal 2 (Week 3): Patient will demonstrate functional problem solving for mildly complex tasks with Min A multimodal cues.  SLP Short Term Goal 3 (Week 3): Patient will identify 3 activities that will be unsafe at discharge with Min A multimodal cues.  SLP Short Term Goal 4 (Week 3): Patient will alternate attention between 2 tasks for 30 minutes with Min A verbal cues for redirection.   Skilled Therapeutic Interventions: Skilled treatment session focused on cognitive goals. SLP facilitated session by providing Total A for recall of her current medications and their functions and required Mod A verbal cues for problem solving during a QD pill box organization task. Patient also participated in visual scanning tasks with extra time and Mod I. Patient was also Mod I for alternating attention between tasks for ~30 minutes.  Patient handed off to OT. Continue with current plan of care.      Function:  Eating Eating   Modified Consistency Diet: No Eating Assist Level: Supervision or verbal cues;Set up assist for   Eating Set Up Assist For: Opening containers;Cutting food       Cognition Comprehension Comprehension assist level: Understands basic 90% of the time/cues < 10% of the time  Expression   Expression assist level: Expresses basic needs/ideas: With no assist  Social Interaction Social Interaction assist level: Interacts appropriately 75 - 89% of the time - Needs redirection for appropriate language or to initiate interaction.  Problem Solving Problem solving assist level: Solves basic 75 -  89% of the time/requires cueing 10 - 24% of the time  Memory Memory assist level: Recognizes or recalls 75 - 89% of the time/requires cueing 10 - 24% of the time    Pain Pain Assessment Pain Assessment: No/denies pain  Therapy/Group: Individual Therapy  Kyrstan Gotwalt 07/20/2017, 1:48 PM

## 2017-07-20 NOTE — Progress Notes (Signed)
Social Work Patient ID: Ebony Jones, female   DOB: 06-Oct-1970, 46 y.o.   MRN: [id number]   Have reviewed this week's team conference with pt, boyfriend, mother and today with pt's daughter.  They are aware that we continue to plan for d/c on 12/21 (did alert them that it may be earlier as pt making very good progress).  Boyfriend has reported that he does not feel he can provide the needed supervision to pt and is no longer offering his home as d/c location.  Mother reports that she and pt's daughter, Ebony Jones (34), have talked and daughter has agreed to have pt d/c to her home where she and her own boyfriend will cover 24/7 supervision.   Discussed this plan with pt today who is in agreement.  We have planned for daughter to come in on Monday at 9:00 to begin family education.  Will continue to follow.  Deania Siguenza, LCSW

## 2017-07-20 NOTE — Progress Notes (Signed)
Occupational Therapy Session Note  Patient Details  Name: Ebony Jones MRN: [id number] Date of Birth: 09-28-1970  Today's Date: 07/20/2017 OT Individual Time: 1001-1056 OT Individual Time Calculation (min): 55 min   Short Term Goals: Week 2:  OT Short Term Goal 1 (Week 2): Pt will locate items on R using compensatory strategies with mod multimodal cues.  OT Short Term Goal 2 (Week 2): Pt will be orientated x 4 with mod multimodal cues. OT Short Term Goal 3 (Week 2): Pt will perform clothing management and hygiene after toileting with close supervision for balance and min cues for safety awareness.  Skilled Therapeutic Interventions/Progress Updates:    Tx focus on cognitive remediation, functional ambulation without device, safety, and dynamic balance during functional tasks.   Pt greeted in recliner with RN present administering meds. Agreeable to shower. Pt ambulating around room with supervision to gathering necessary items, stooping to lower drawers without LOBs. She ambulated to shower and bathed while standing with supervision for min safety cuing. Pt stooping to floor to retrieve dropped items x2. She dressed mostly in standing, with support of wall and grab bar without LOBs. Min cues for locating all clothing items due to visual deficits. She cleaned up bathroom afterwards. Pt ambulating to therapy office to retrieve hair dryer and then returned to room to dry/fix hair while standing at sink. Pt requiring min vcs for locating therapy office to return hair dryer to where she had found it. Pt engaging in coffee making prep in family room and then made her new bed (enclosure bed removed from room)! Once set up with clean linen, she required min vcs for orienting fitted sheet but able to self sequence remainder of tasks and problem solve independently 90% of time. Pt initiated cleaning/organizing her room, also answered telephone x2 during tx to notify family members she was in therapy and would  call them back after. Pt left in recliner with safety belt applied and all needs within reach.   She still c/o Rt eye blurriness/visual disturbance, however improving her ability to compensate during tx    Therapy Documentation Precautions:  Precautions Precautions: Fall Precaution Comments: TBI; visual deficits, very impulsive Restrictions Weight Bearing Restrictions: No Vital Signs: Therapy Vitals BP: 122/68 Pain: No c/o pain during tx    ADL: ADL ADL Comments: see functional naviagtor     See Function Navigator for Current Functional Status.   Therapy/Group: Individual Therapy  Thaddaeus Granja A Kieon Lawhorn 07/20/2017, 12:39 PM

## 2017-07-20 NOTE — Progress Notes (Signed)
Pt resting in chair with safety belt in place. Enclosure bed removed from room per MD orders and replaced with low bed. Pt responding well, asked staff to keep bed safe so she can take home with her. Assisted OT to make up bed. Safe and within sight of staff. Toileting needs addressed.

## 2017-07-21 ENCOUNTER — Inpatient Hospital Stay (HOSPITAL_COMMUNITY): Payer: Self-pay | Admitting: Speech Pathology

## 2017-07-21 NOTE — Progress Notes (Signed)
[facility] PHYSICAL MEDICINE & REHABILITATION     PROGRESS NOTE    Subjective/Complaints: Did well in the low bed.  Denies any pain this morning.  ROS: pt denies nausea, vomiting, diarrhea, cough, shortness of breath or chest pain   Objective: Vital Signs: Blood pressure 106/61, pulse 70, temperature 97.8 F (36.6 C), temperature source Oral, resp. rate 14, height 5\' 7"  (1.702 m), weight 66.5 kg (146 lb 9.7 oz), SpO2 99 %. No results found. No results for input(s): WBC, HGB, HCT, PLT in the last 72 hours. No results for input(s): NA, K, CL, GLUCOSE, BUN, CREATININE, CALCIUM in the last 72 hours.  Invalid input(s): CO CBG (last 3)  No results for input(s): GLUCAP in the last 72 hours.  Wt Readings from Last 3 Encounters:  07/03/17 66.5 kg (146 lb 9.7 oz)  07/03/17 66.5 kg (146 lb 9.7 oz)  03/28/17 68 kg (150 lb)    Physical Exam:  Constitutional: She appearswell-developedand well-nourished.No distress.in enclosure bed HENT:  Head:Normocephalicand atraumatic.  Eyes: Mydriasis right eye.  Neck:Normal range of motion.Neck supple.  Cardiovascular: RRR without murmur. No JVD    .  Respiratory:CTA Bilaterally without wheezes or rales. Normal effort      EX:HBZJ.Bowel sounds are normal. She exhibitsno distension. There isno tenderness.  Musculoskeletal: She exhibits noedemaor tenderness.  Neurological: alert.  Acranial nerve deficitis present.    Improving gaze.  Left eye with better medial movements still lacks superior movements.  Gaze remains disconjugate.  language normal.  Improving attention.  As for memory and limited insight and awareness.  Moves all 4 limbs freely.   Skin: Skin iswarmand dry.  Psychiatric: Impulsive but redirectable   Assessment/Plan: 1. 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 Physiatrist is  providing close team supervision and 24 hour management of active medical problems listed below. Physiatrist and rehab team continue to assess barriers to discharge/monitor patient progress toward functional and medical goals.  Function:  Bathing Bathing position Bathing activity did not occur: Refused Position: Shower  Bathing parts Body parts bathed by patient: Right arm, Left arm, Chest, Abdomen, Front perineal area, Buttocks, Right upper leg, Left upper leg, Right lower leg, Left lower leg Body parts bathed by helper: Back  Bathing assist Assist Level: Supervision or verbal cues   Set up : To obtain items  Upper Body Dressing/Undressing Upper body dressing   What is the patient wearing?: Pull over shirt/dress, Bra Bra - Perfomed by patient: Thread/unthread right bra strap, Thread/unthread left bra strap, Hook/unhook bra (pull down sports bra)   Pull over shirt/dress - Perfomed by patient: Thread/unthread right sleeve, Thread/unthread left sleeve, Put head through opening, Pull shirt over trunk Pull over shirt/dress - Perfomed by helper: Thread/unthread right sleeve, Thread/unthread left sleeve Button up shirt - Perfomed by patient: Pull shirt around back, Button/unbutton shirt, Thread/unthread left sleeve, Thread/unthread right sleeve Button up shirt - Perfomed by helper: Thread/unthread right sleeve, Thread/unthread left sleeve    Upper body assist Assist Level: Supervision or verbal cues      Lower Body Dressing/Undressing Lower body dressing   What is the patient wearing?: Non-skid slipper socks, Pants Underwear - Performed by patient: Thread/unthread right underwear leg, Thread/unthread left underwear leg, Pull underwear up/down Underwear - Performed by helper: Pull underwear up/down Pants- Performed by patient: Thread/unthread right pants leg, Thread/unthread left pants leg, Pull pants up/down Pants- Performed by helper: Pull pants up/down  Non-skid slipper socks- Performed  by patient: Don/doff right sock, Don/doff left sock                    Lower body assist Assist for lower body dressing: Supervision or verbal cues      Toileting Toileting Toileting activity did not occur: Refused Toileting steps completed by patient: Adjust clothing prior to toileting, Performs perineal hygiene, Adjust clothing after toileting Toileting steps completed by helper: Adjust clothing after toileting Toileting Assistive Devices: Grab bar or rail  Toileting assist Assist level: Supervision or verbal cues   Transfers Chair/bed transfer   Chair/bed transfer method: Ambulatory Chair/bed transfer assist level: Supervision or verbal cues Chair/bed transfer assistive device: Armrests     Locomotion Ambulation     Max distance: 250 Assist level: Supervision or verbal cues   Wheelchair   Type: Manual Max wheelchair distance: 50 Assist Level: Touching or steadying assistance (Pt > 75%)  Cognition Comprehension Comprehension assist level: Understands basic 90% of the time/cues < 10% of the time  Expression Expression assist level: Expresses basic needs/ideas: With no assist  Social Interaction Social Interaction assist level: Interacts appropriately 75 - 89% of the time - Needs redirection for appropriate language or to initiate interaction.  Problem Solving Problem solving assist level: Solves basic 75 - 89% of the time/requires cueing 10 - 24% of the time  Memory Memory assist level: Recognizes or recalls 75 - 89% of the time/requires cueing 10 - 24% of the time   Medical Problem List and Plan: 1.Functional and cognitive deficitssecondary to TBI -continue therapies -RLAS  V+.    2. DVT Prophylaxis/Anticoagulation: Patient is ambulatory 3.Headaches/Pain Management:tylenol prn 4. Mood:currently has no awareness of accident or deficits. LCSW to follow for now and complete evaluation when appropriate 5. Neuropsych: This patientis  notcapable of making decisions on herown behalf. -continue HS seroquel at HS   -inderal effective for headache.  Perhaps helping somewhat with impulsivity too. Dose at 20mg  tid but BP/HR soft---will need to watch closely  -sleep chart    -Methylphenidate increased to 10 mg twice daily -Did well with low bed, continue 6. Skin/Wound Care:routine pressure relief measures. 7. Fluids/Electrolytes/Nutrition: Continue to encourage p.o. Intake  8. ABLA: resolving.    -hgb up to 13.9   10. Optho: mydriasis right pupil, left CN III palsy-showing some improvement   -consvt care, patching recommended by optho   -outpt neuro-eye follow up at discharge depending upon recovery         LOS (Days) 18 A FACE TO Pamelia [place] T, MD 07/21/2017 9:24 AM

## 2017-07-21 NOTE — Progress Notes (Signed)
Speech Language Pathology Daily Session Note  Patient Details  Name: Ebony Jones MRN: [id number] Date of Birth: [birth year]  Today's Date: 07/21/2017 SLP Individual Time: 1093-2355 SLP Individual Time Calculation (min): 38 min  Short Term Goals: Week 3: SLP Short Term Goal 1 (Week 3): Patient will utilize external memory aids to recall daily information with Min A multimodal cues. SLP Short Term Goal 2 (Week 3): Patient will demonstrate functional problem solving for mildly complex tasks with Min A multimodal cues.  SLP Short Term Goal 3 (Week 3): Patient will identify 3 activities that will be unsafe at discharge with Min A multimodal cues.  SLP Short Term Goal 4 (Week 3): Patient will alternate attention between 2 tasks for 30 minutes with Min A verbal cues for redirection.   Skilled Therapeutic Interventions:  Pt was seen for skilled ST targeting cognitive goals.  SLP attempted to check toleration of regular textures; however, pt reported feeling nauseated and declined any food.  SLP facilitated the session with a previously taught card game to facilitate recall of new information.  Pt was able to recall ~75% of task rules and procedures with min question cues and was able to plan and execute a problem solving strategy with supervision verbal cues.  Pt needed min verbal cues for route recall when ambulating back to her room.  Pt recorded at least 5 details into her memory notebook with min assist verbal cues for recall and organization.  She also needed min verbal cues to explain rationale for changes from enclosure bed to low bed due to confabulation.  Pt was able to return demonstration for use of call bell with supervision question cues.  She was left in recliner with quick release belt donned and call bell within reach.  RN made aware.  Continue per current plan of care.    Function:  Eating Eating                 Cognition Comprehension Comprehension assist level: Understands  basic 90% of the time/cues < 10% of the time  Expression   Expression assist level: Expresses basic 90% of the time/requires cueing < 10% of the time.  Social Interaction Social Interaction assist level: Interacts appropriately 50 - 74% of the time - May be physically or verbally inappropriate.  Problem Solving Problem solving assist level: Solves basic 75 - 89% of the time/requires cueing 10 - 24% of the time  Memory Memory assist level: Recognizes or recalls 50 - 74% of the time/requires cueing 25 - 49% of the time    Pain Pain Assessment Pain Assessment: No/denies pain  Therapy/Group: Individual Therapy  [place], Selinda Orion 07/21/2017, 1:44 PM

## 2017-07-22 NOTE — Progress Notes (Signed)
Occupational Therapy Weekly Progress Note  Patient Details  Name: Ebony Jones MRN: [id number] Date of Birth: Jul 03, 1971  Beginning of progress report period: 07/12/17 End of progress report period: 07/22/17  Patient has met 3 of 3 short term goals.    Pt has made excellent progress during this report period. She now completes all BADL tasks and functional ambulatory transfers at supervision level. Pt is exhibiting increased ability to compensate for visual deficits. Her safety awareness and attention have also improved during functional tasks. Family education has been scheduled this week to promote safe transition home at time of d/c on 12/21. She is on track for LTG achievement.   Patient continues to demonstrate the following deficits: muscle weakness, decreased cardiorespiratoy endurance, decreased coordination and decreased motor planning, field cut, decreased attention, decreased awareness, decreased problem solving, decreased safety awareness and decreased memory and decreased standing balance and decreased postural control and therefore will continue to benefit from skilled OT intervention to enhance overall performance with BADL and iADL.  Patient progressing toward long term goals..  Continue plan of care.  OT Short Term Goals Week 2:  OT Short Term Goal 1 (Week 2): Pt will locate items on R using compensatory strategies with mod multimodal cues.  OT Short Term Goal 1 - Progress (Week 2): Met OT Short Term Goal 2 (Week 2): Pt will be orientated x 4 with mod multimodal cues. OT Short Term Goal 2 - Progress (Week 2): Met OT Short Term Goal 3 (Week 2): Pt will perform clothing management and hygiene after toileting with close supervision for balance and min cues for safety awareness. OT Short Term Goal 3 - Progress (Week 2): Met Week 3:  OT Short Term Goal 1 (Week 3): STGs=LTGs due to ELOS  Therapy Documentation Precautions:  Precautions Precautions: Fall Precaution Comments: TBI;  visual deficits, very impulsive Restrictions Weight Bearing Restrictions: No Vital Signs: Therapy Vitals Temp: 97.7 F (36.5 C) Temp Source: Oral Pulse Rate: 65 Resp: 18 BP: 104/73 Patient Position (if appropriate): Sitting Oxygen Therapy SpO2: 99 % O2 Device: Not Delivered ADL: ADL ADL Comments: see functional naviagtor     See Function Navigator for Current Functional Status.   Therapy/Group: Individual Therapy  Shirell Struthers A Kaidon Kinker 07/22/2017, 4:50 PM

## 2017-07-22 NOTE — Progress Notes (Signed)
[place] PHYSICAL MEDICINE & REHABILITATION     PROGRESS NOTE    Subjective/Complaints: Lying in bed.  No headache today.  Was able to sleep  ROS: Limited due to cognitive/behavioral   Objective: Vital Signs: Blood pressure 90/69, pulse 68, temperature 98.4 F (36.9 C), temperature source Oral, resp. rate 18, height 5\' 7"  (1.702 m), weight 66.5 kg (146 lb 9.7 oz), SpO2 98 %. No results found. No results for input(s): WBC, HGB, HCT, PLT in the last 72 hours. No results for input(s): NA, K, CL, GLUCOSE, BUN, CREATININE, CALCIUM in the last 72 hours.  Invalid input(s): CO CBG (last 3)  No results for input(s): GLUCAP in the last 72 hours.  Wt Readings from Last 3 Encounters:  07/03/17 66.5 kg (146 lb 9.7 oz)  07/03/17 66.5 kg (146 lb 9.7 oz)  03/28/17 68 kg (150 lb)    Physical Exam:  Constitutional: She appearswell-developedand well-nourished.No distress.in enclosure bed HENT:  Head:Normocephalicand atraumatic.  Eyes: Mydriasis right eye.  Neck:Normal range of motion.Neck supple.  Cardiovascular: RRR without murmur. No JVD    .  Respiratory:CTA Bilaterally without wheezes or rales. Normal effort      YJ:EHUD.Bowel sounds are normal. She exhibitsno distension. There isno tenderness.  Musculoskeletal: She exhibits noedemaor tenderness.  Neurological: alert.  Acranial nerve deficitis present.    Improving gaze.  Left eye with better medial movements still lacks superior movements.  Gaze remains disconjugate--stable.  language normal.    Moves all 4 limbs freely.   Skin: Skin iswarmand dry.  Psychiatric: Impulsive but redirectable   Assessment/Plan: 1. 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 Physiatrist is providing close team supervision and 24 hour management of active medical problems listed below. Physiatrist and  rehab team continue to assess barriers to discharge/monitor patient progress toward functional and medical goals.  Function:  Bathing Bathing position Bathing activity did not occur: Refused Position: Shower  Bathing parts Body parts bathed by patient: Right arm, Left arm, Chest, Abdomen, Front perineal area, Buttocks, Right upper leg, Left upper leg, Right lower leg, Left lower leg Body parts bathed by helper: Back  Bathing assist Assist Level: Supervision or verbal cues   Set up : To obtain items  Upper Body Dressing/Undressing Upper body dressing   What is the patient wearing?: Pull over shirt/dress, Bra Bra - Perfomed by patient: Thread/unthread right bra strap, Thread/unthread left bra strap, Hook/unhook bra (pull down sports bra)   Pull over shirt/dress - Perfomed by patient: Thread/unthread right sleeve, Thread/unthread left sleeve, Put head through opening, Pull shirt over trunk Pull over shirt/dress - Perfomed by helper: Thread/unthread right sleeve, Thread/unthread left sleeve Button up shirt - Perfomed by patient: Pull shirt around back, Button/unbutton shirt, Thread/unthread left sleeve, Thread/unthread right sleeve Button up shirt - Perfomed by helper: Thread/unthread right sleeve, Thread/unthread left sleeve    Upper body assist Assist Level: Supervision or verbal cues      Lower Body Dressing/Undressing Lower body dressing   What is the patient wearing?: Non-skid slipper socks, Pants Underwear - Performed by patient: Thread/unthread right underwear leg, Thread/unthread left underwear leg, Pull underwear up/down Underwear - Performed by helper: Pull underwear up/down Pants- Performed by patient: Thread/unthread right pants leg, Thread/unthread left pants leg, Pull pants up/down Pants- Performed by helper: Pull pants up/down Non-skid slipper socks- Performed by patient: Don/doff right sock, Don/doff left sock  Lower body assist Assist for lower  body dressing: Supervision or verbal cues      Toileting Toileting Toileting activity did not occur: Refused Toileting steps completed by patient: Adjust clothing prior to toileting, Performs perineal hygiene, Adjust clothing after toileting Toileting steps completed by helper: Adjust clothing after toileting Toileting Assistive Devices: Grab bar or rail  Toileting assist Assist level: Supervision or verbal cues   Transfers Chair/bed transfer   Chair/bed transfer method: Ambulatory Chair/bed transfer assist level: Supervision or verbal cues Chair/bed transfer assistive device: Armrests     Locomotion Ambulation     Max distance: 250 Assist level: Supervision or verbal cues   Wheelchair   Type: Manual Max wheelchair distance: 50 Assist Level: Touching or steadying assistance (Pt > 75%)  Cognition Comprehension Comprehension assist level: Understands basic 90% of the time/cues < 10% of the time  Expression Expression assist level: Expresses basic 90% of the time/requires cueing < 10% of the time.  Social Interaction Social Interaction assist level: Interacts appropriately 50 - 74% of the time - May be physically or verbally inappropriate.  Problem Solving Problem solving assist level: Solves basic 75 - 89% of the time/requires cueing 10 - 24% of the time  Memory Memory assist level: Recognizes or recalls 50 - 74% of the time/requires cueing 25 - 49% of the time   Medical Problem List and Plan: 1.Functional and cognitive deficitssecondary to TBI -continue therapies -RLAS  V+.    2. DVT Prophylaxis/Anticoagulation: Patient is ambulatory 3.Headaches/Pain Management:tylenol prn 4. Mood:currently has no awareness of accident or deficits. LCSW to follow for now and complete evaluation when appropriate 5. Neuropsych: This patientis notcapable of making decisions on herown behalf. -continue HS seroquel at HS   -inderal effective for  headache.  Perhaps helping somewhat with impulsivity too. Dose at 20mg  tid but BP/HR soft--- continue to observe blood pressures as they remain somewhat low  -sleep chart    -Methylphenidate 10 mg twice daily -Continue low bed 6. Skin/Wound Care:routine pressure relief measures. 7. Fluids/Electrolytes/Nutrition: Continue to encourage p.o. Intake  8. ABLA: resolving.    -hgb up to 13.9   10. Optho: mydriasis right pupil, left CN III palsy-showing some improvement   -consvt care, patching recommended by optho   -outpt neuro-eye follow up at discharge depending upon recovery         LOS (Days) Regino Ramirez T, MD 07/22/2017 9:08 AM

## 2017-07-23 ENCOUNTER — Ambulatory Visit (HOSPITAL_COMMUNITY): Payer: Self-pay

## 2017-07-23 ENCOUNTER — Inpatient Hospital Stay (HOSPITAL_COMMUNITY): Payer: Medicaid Other | Admitting: Speech Pathology

## 2017-07-23 ENCOUNTER — Encounter (HOSPITAL_COMMUNITY): Payer: Self-pay | Admitting: Occupational Therapy

## 2017-07-23 ENCOUNTER — Inpatient Hospital Stay (HOSPITAL_COMMUNITY): Payer: Medicaid Other | Admitting: Occupational Therapy

## 2017-07-23 NOTE — Progress Notes (Signed)
Occupational Therapy Session Note  Patient Details  Name: Ebony Jones MRN: [id number] Date of Birth: 24-Aug-1970  Today's Date: 07/23/2017 OT Individual Time: 1017-5102 OT Individual Time Calculation (min): 61 min   Short Term Goals: Week 3:  OT Short Term Goal 1 (Week 3): STGs=LTGs due to ELOS  Skilled Therapeutic Interventions/Progress Updates:    Tx focus on ADL retraining, pt/family education, balance, and awareness during functional tasks.   Pt greeted in recliner via SLP handoff. Daughter Debe Coder present for family education. Pt ambulated to bathroom to shower/dress/toilet with supervision. Daughter asking questions about ADL completion at home and home setup. Discussed using TTB for tub shower transfers with pt able to complete transfer in large apartment bathroom with supervision. Had pt engage in simulated meal prep, sweeping kitchen floor, vacuuming. Modeled technique of providing questioning cues and facilitating pt problem solving for dtr. Pt stooping to floor and ambulating with items without LOBs, required mod safety cues for mgt of vacuum cleaner. [place] demonstrating carryover of education while pt was in room and wanting to change shirt/locate new set of clothing. Pt pathfinding throughout unit with min questioning cues, able to retrieve hair dryer from therapy office and return it after she finished using it without directional cues. Cleared daughter for providing supervision during toilet transfers. At session exit pt was supine in bed, about to watch a movie with [place].    Therapy Documentation Precautions:  Precautions Precautions: Fall Precaution Comments: TBI; visual deficits, very impulsive Restrictions Weight Bearing Restrictions: No Pain: No c/o pain during tx   ADL: ADL ADL Comments: see functional naviagtor     See Function Navigator for Current Functional Status.   Therapy/Group: Individual Therapy  Crystallee Werden A Deniyah Dillavou 07/23/2017, 12:31 PM

## 2017-07-23 NOTE — Progress Notes (Signed)
Occupational Therapy Session Note  Patient Details  Name: Ebony Jones MRN: [id number] Date of Birth: February 20, 1971  Today's Date: 07/23/2017 OT Individual Time: 1300-1345 OT Individual Time Calculation (min): 45 min     Skilled Therapeutic Interventions/Progress Updates:    Upon entering the room, pt seated on EOB with daughter present for continues family education. Pt given three items to remember and locate in gift shop. Pt ambulating to gift shop, an unfamiliar place and location, with overall supervision and without use of AD. Pt able to remember and locate all items with increased time and min verbal guidance cues. Pt able to navigate back to room from another floor without verbal cues needed and pt tracing her exact path. Pt returning to room with all needs within reach. Caregiver with no questions at this time.   Therapy Documentation Precautions:  Precautions Precautions: Fall Precaution Comments: TBI; visual deficits, very impulsive Restrictions Weight Bearing Restrictions: No General:   Vital Signs: Therapy Vitals Temp: 98.2 F (36.8 C) Temp Source: Oral Pulse Rate: 64 Resp: 17 BP: 100/66 Patient Position (if appropriate): Lying Oxygen Therapy SpO2: 97 % O2 Device: Not Delivered Pain: Pain Assessment Pain Assessment: No/denies pain ADL: ADL ADL Comments: see functional naviagtor Vision   Perception    Praxis   Exercises:   Other Treatments:    See Function Navigator for Current Functional Status.   Therapy/Group: Individual Therapy  Gypsy Decant 07/23/2017, 5:13 PM

## 2017-07-23 NOTE — Progress Notes (Signed)
Physical Therapy Session Note  Patient Details  Name: Ebony Jones MRN: [id number] Date of Birth: November 24, 1970  Today's Date: 07/23/2017 PT Individual Time: 1120-1200 PT Individual Time Calculation (min): 40 min   Short Term Goals: Week 3:  PT Short Term Goal 1 (Week 3): STG =LTG due to ELOS  Skilled Therapeutic Interventions/Progress Updates:    Patient seen for caregiver training with daughter present.  Educated daughter on level of assist for mobility due to safety, vision and cognition.  Demonstrated balance issues with balance testing including Berg (scored 53/56) and DGI (scored 20/24) with pt loss of balance during turn and stop activity minguard for recovery.  Patient reporting complaints again about eye and reviewed education with pt again and with daughter about not correcting vision while continuing to improve without correction.  Discussed will likely have referral when she leaves hospital for neuro-ophthalomological consult.  Daughter able to return demonstrate safe supervision/guarding skills for assisting pt with mobility.  Also discussed follow up therapy versus accessing community fitness resources.  Patient in care of daughter end of session.  Therapy Documentation Precautions:  Precautions Precautions: Fall Precaution Comments: TBI; visual deficits, very impulsive Restrictions Weight Bearing Restrictions: No Pain: Pain Assessment Pain Assessment: No/denies pain   See Function Navigator for Current Functional Status.   Therapy/Group: Individual Therapy  Reginia Naas 07/23/2017, 3:57 PM

## 2017-07-23 NOTE — Progress Notes (Signed)
[name] PHYSICAL MEDICINE & REHABILITATION     PROGRESS NOTE    Subjective/Complaints: Just waking up.  No new complaints.  ROS: pt denies nausea, vomiting, diarrhea, cough, shortness of breath or chest pain   Objective: Vital Signs: Blood pressure (!) 92/59, pulse (!) 57, temperature 97.7 F (36.5 C), temperature source Oral, resp. rate 16, height 5\' 7"  (1.702 m), weight 66.5 kg (146 lb 9.7 oz), SpO2 98 %. No results found. No results for input(s): WBC, HGB, HCT, PLT in the last 72 hours. No results for input(s): NA, K, CL, GLUCOSE, BUN, CREATININE, CALCIUM in the last 72 hours.  Invalid input(s): CO CBG (last 3)  No results for input(s): GLUCAP in the last 72 hours.  Wt Readings from Last 3 Encounters:  07/03/17 66.5 kg (146 lb 9.7 oz)  07/03/17 66.5 kg (146 lb 9.7 oz)  03/28/17 68 kg (150 lb)    Physical Exam:  Constitutional: She appearswell-developedand well-nourished.No distress.in enclosure bed HENT:  Head:Normocephalicand atraumatic.  Eyes: Mydriasis right eye.  Neck:Normal range of motion.Neck supple.  Cardiovascular: RRR without murmur. No JVD     .  Respiratory:CTA Bilaterally without wheezes or rales. Normal effort     YH:CWCB.Bowel sounds are normal. She exhibitsno distension. There isno tenderness.  Musculoskeletal: She exhibits noedemaor tenderness.  Neurological: alert.  Ongoing short-term memory deficits.  She does not remember my name but does remember that I am a doctor.    Improving gaze.  Left eye with better medial movements still lacks superior movements.  Gaze remains disconjugate--stable.  language normal.    Moves all 4 limbs freely.   Skin: Skin iswarmand dry.  Psychiatric: Impulsive but redirectable   Assessment/Plan: 1. 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 Physiatrist is providing  close team supervision and 24 hour management of active medical problems listed below. Physiatrist and rehab team continue to assess barriers to discharge/monitor patient progress toward functional and medical goals.  Function:  Bathing Bathing position Bathing activity did not occur: Refused Position: Shower  Bathing parts Body parts bathed by patient: Right arm, Left arm, Chest, Abdomen, Front perineal area, Buttocks, Right upper leg, Left upper leg, Right lower leg, Left lower leg Body parts bathed by helper: Back  Bathing assist Assist Level: Supervision or verbal cues   Set up : To obtain items  Upper Body Dressing/Undressing Upper body dressing   What is the patient wearing?: Pull over shirt/dress, Bra Bra - Perfomed by patient: Thread/unthread right bra strap, Thread/unthread left bra strap, Hook/unhook bra (pull down sports bra)   Pull over shirt/dress - Perfomed by patient: Thread/unthread right sleeve, Thread/unthread left sleeve, Put head through opening, Pull shirt over trunk Pull over shirt/dress - Perfomed by helper: Thread/unthread right sleeve, Thread/unthread left sleeve Button up shirt - Perfomed by patient: Pull shirt around back, Button/unbutton shirt, Thread/unthread left sleeve, Thread/unthread right sleeve Button up shirt - Perfomed by helper: Thread/unthread right sleeve, Thread/unthread left sleeve    Upper body assist Assist Level: Supervision or verbal cues      Lower Body Dressing/Undressing Lower body dressing   What is the patient wearing?: Non-skid slipper socks, Pants Underwear - Performed by patient: Thread/unthread right underwear leg, Thread/unthread left underwear leg, Pull underwear up/down Underwear - Performed by helper: Pull underwear up/down Pants- Performed by patient: Thread/unthread right pants leg, Thread/unthread left pants leg, Pull pants up/down Pants- Performed by helper: Pull  pants up/down Non-skid slipper socks- Performed by patient:  Don/doff right sock, Don/doff left sock                    Lower body assist Assist for lower body dressing: Supervision or verbal cues      Toileting Toileting Toileting activity did not occur: Refused Toileting steps completed by patient: Adjust clothing prior to toileting, Performs perineal hygiene, Adjust clothing after toileting Toileting steps completed by helper: Adjust clothing after toileting Toileting Assistive Devices: Grab bar or rail  Toileting assist Assist level: Supervision or verbal cues   Transfers Chair/bed transfer   Chair/bed transfer method: Ambulatory Chair/bed transfer assist level: Supervision or verbal cues Chair/bed transfer assistive device: Armrests     Locomotion Ambulation     Max distance: 250 Assist level: Supervision or verbal cues   Wheelchair   Type: Manual Max wheelchair distance: 50 Assist Level: Touching or steadying assistance (Pt > 75%)  Cognition Comprehension Comprehension assist level: Understands basic 90% of the time/cues < 10% of the time  Expression Expression assist level: Expresses basic 90% of the time/requires cueing < 10% of the time.  Social Interaction Social Interaction assist level: Interacts appropriately 90% of the time - Needs monitoring or encouragement for participation or interaction.  Problem Solving Problem solving assist level: Solves basic 75 - 89% of the time/requires cueing 10 - 24% of the time  Memory Memory assist level: Recognizes or recalls 75 - 89% of the time/requires cueing 10 - 24% of the time   Medical Problem List and Plan: 1.Functional and cognitive deficitssecondary to TBI -continue therapies.  In the education this week -RLAS  V+.    2. DVT Prophylaxis/Anticoagulation: Patient is ambulatory 3.Headaches/Pain Management:tylenol prn 4. Mood:currently has no awareness of accident or deficits. LCSW to follow for now and complete evaluation when appropriate 5.  Neuropsych: This patientis notcapable of making decisions on herown behalf. -continue HS seroquel at HS   -inderal effective for headache.  Perhaps helping somewhat with impulsivity too. Dose at 20mg  tid but BP/HR soft--- continue to observe blood pressures as they remain somewhat low  -sleep chart    -Methylphenidate 10 mg twice daily -Is doing well with the low bed 6. Skin/Wound Care:routine pressure relief measures. 7. Fluids/Electrolytes/Nutrition: Continue to encourage p.o. Intake  8. ABLA: resolving.    -hgb up to 13.9   10. Optho: mydriasis right pupil, left CN III palsy-showing some improvement   -consvt care, patching recommended by optho   -outpt neuro-eye follow up at discharge depending upon recovery         LOS (Days) 20 A FACE TO [place] T, MD 07/23/2017 8:48 AM

## 2017-07-23 NOTE — Plan of Care (Signed)
Discontinued wheelchair goals as pt ambulatory and no need for w/c at d/c.

## 2017-07-23 NOTE — Progress Notes (Signed)
Speech Language Pathology Daily Session Note  Patient Details  Name: Ebony Jones MRN: [id number] Date of Birth: Oct 16, 1970  Today's Date: 07/23/2017 SLP Individual Time: 5361-4431 SLP Individual Time Calculation (min): 55 min  Short Term Goals: Week 3: SLP Short Term Goal 1 (Week 3): Patient will utilize external memory aids to recall daily information with Min A multimodal cues. SLP Short Term Goal 2 (Week 3): Patient will demonstrate functional problem solving for mildly complex tasks with Min A multimodal cues.  SLP Short Term Goal 3 (Week 3): Patient will identify 3 activities that will be unsafe at discharge with Min A multimodal cues.  SLP Short Term Goal 4 (Week 3): Patient will alternate attention between 2 tasks for 30 minutes with Min A verbal cues for redirection.   Skilled Therapeutic Interventions: Skilled treatment session focused on completion of patient and family education with the patient's daughter. SLP facilitated session by providing education in regards to the patient's current cognitive functioning and strategies to utilize at home to maximize recall, awareness, problem solving and overall safety with functional and familiar tasks. All verbalized understanding and asked appropriate questions. Handouts were also given to reinforce information. SLP also re-administered the MoCA. Patient scored 24/30 points with a score of 26 or above considered normal. Patient continues to demonstrate impairments in short-term recall and attention. Patient handed off to OT. Continue with current plan of care.      Function:   Cognition Comprehension Comprehension assist level: Understands basic 90% of the time/cues < 10% of the time  Expression   Expression assist level: Expresses basic 90% of the time/requires cueing < 10% of the time.  Social Interaction Social Interaction assist level: Interacts appropriately 90% of the time - Needs monitoring or encouragement for participation or  interaction.  Problem Solving Problem solving assist level: Solves basic 75 - 89% of the time/requires cueing 10 - 24% of the time  Memory Memory assist level: Recognizes or recalls 75 - 89% of the time/requires cueing 10 - 24% of the time    Pain No/Denies Pain   Therapy/Group: Individual Therapy  Cullen Vanallen 07/23/2017, 3:08 PM

## 2017-07-24 ENCOUNTER — Inpatient Hospital Stay (HOSPITAL_COMMUNITY): Payer: Medicaid Other | Admitting: Speech Pathology

## 2017-07-24 ENCOUNTER — Inpatient Hospital Stay (HOSPITAL_COMMUNITY): Payer: Self-pay

## 2017-07-24 ENCOUNTER — Inpatient Hospital Stay (HOSPITAL_COMMUNITY): Payer: Medicaid Other | Admitting: Occupational Therapy

## 2017-07-24 LAB — BASIC METABOLIC PANEL
Anion gap: 8 (ref 5–15)
BUN: 7 mg/dL (ref 6–20)
CO2: 24 mmol/L (ref 22–32)
Calcium: 9.4 mg/dL (ref 8.9–10.3)
Chloride: 107 mmol/L (ref 101–111)
Creatinine, Ser: 0.8 mg/dL (ref 0.44–1.00)
GFR calc Af Amer: >60 mL/min (ref >60)
GFR calc non Af Amer: >60 mL/min (ref >60)
Glucose, Bld: 106 mg/dL — ABNORMAL HIGH (ref 65–99)
Potassium: 3.7 mmol/L (ref 3.5–5.1)
Sodium: 139 mmol/L (ref 135–145)

## 2017-07-24 NOTE — Progress Notes (Signed)
Physical Therapy Discharge Summary  Patient Details  Name: Ebony Jones MRN: [id number] Date of Birth: 1971/04/27  Today's Date: 07/24/2017 PT Individual Time: 1400-1425 PT Individual Time Calculation (min): 25 min    Patient has met 12 of 12 long term goals due to improved activity tolerance, improved balance, increased strength, ability to compensate for deficits, improved attention, improved awareness and improved coordination.  Patient to discharge at an ambulatory level Supervision.   Patient's care partner is independent to provide the necessary physical and cognitive assistance at discharge.  Reasons goals not met: N/A pt achieved all LTGs.  Recommendation:  Patient will benefit from ongoing skilled PT services in home health setting to continue to advance safe functional mobility, address ongoing impairments in cognition, high level balance/gait, and minimize fall risk.  Equipment: No equipment provided  Reasons for discharge: discharge from hospital  Patient/family agrees with progress made and goals achieved: Yes  PT Discharge Precautions/Restrictions Precautions Precautions: Fall Precaution Comments: TBI and visual deficits Restrictions Weight Bearing Restrictions: No Vital Signs Therapy Vitals Temp: 98.1 F (36.7 C) Temp Source: Oral Pulse Rate: 73 Resp: 18 BP: 112/75 Patient Position (if appropriate): Sitting Oxygen Therapy SpO2: 100 % O2 Device: Not Delivered Pain Pain Assessment Pain Assessment: No/denies pain Vision/Perception  Vision - Assessment Additional Comments: B eyes tracking correctly in all directions. However, pt reports diplopia when initially looking out of R eye only but able to focus on items with increased time.   Cognition Overall Cognitive Status: Impaired/Different from baseline Arousal/Alertness: Awake/alert Orientation Level: Oriented X4 Attention: Selective;Alternating Sustained Attention: Appears intact Selective Attention:  Appears intact Alternating Attention: Appears intact Memory: Impaired Memory Impairment: Storage deficit;Retrieval deficit;Decreased short term memory;Decreased recall of new information Awareness: Impaired Awareness Impairment: Anticipatory impairment Problem Solving: Impaired Problem Solving Impairment: Functional complex Safety/Judgment: Impaired [place] [employer] Scales of Cognitive Functioning: Purposeful/appropriate Sensation Sensation Light Touch: Appears Intact Stereognosis: Appears Intact Hot/Cold: Appears Intact Proprioception: Appears Intact Coordination Gross Motor Movements are Fluid and Coordinated: Yes Fine Motor Movements are Fluid and Coordinated: Yes Motor  Motor Motor: Within Functional Limits Motor - Discharge Observations: [facility]  Mobility Bed Mobility Rolling Right: 7: Independent Rolling Left: 7: Independent Left Sidelying to Sit: 7: Independent Supine to Sit: 7: Independent Transfers Transfers: Yes Sit to Stand: 5: Supervision Stand to Sit: 5: Supervision Locomotion  Ambulation Ambulation: Yes Ambulation/Gait Assistance: 5: Supervision Ambulation Distance (Feet): 300 Feet Assistive device: None Gait Gait: Yes Gait Pattern: Within Functional Limits Stairs / Additional Locomotion Stairs: Yes Stairs Assistance: 5: Supervision Stair Management Technique: Two rails;One rail Left;Alternating pattern;Forwards Number of Stairs: 12 Curb: 5: Supervision Wheelchair Mobility Wheelchair Mobility: No  Trunk/Postural Assessment  Cervical Assessment Cervical Assessment: Within Functional Limits Thoracic Assessment Thoracic Assessment: Within Functional Limits Lumbar Assessment Lumbar Assessment: Within Functional Limits Postural Control Postural Control: Within Functional Limits  Balance Balance Balance Assessed: Yes Dynamic Sitting Balance Dynamic Sitting - Level of Assistance: 7: Independent Static Standing Balance Static Standing - Level of  Assistance: 5: Stand by assistance Dynamic Standing Balance Dynamic Standing - Level of Assistance: 5: Stand by assistance Extremity Assessment  RUE Assessment RUE Assessment: Within Functional Limits LUE Assessment LUE Assessment: Within Functional Limits RLE Assessment RLE Assessment: Within Functional Limits LLE Assessment LLE Assessment: Within Functional Limits   See Function Navigator for Current Functional Status.  Reginia Naas 07/24/2017, 5:09 PM

## 2017-07-24 NOTE — Progress Notes (Signed)
Occupational Therapy Discharge Summary  Patient Details  Name: Ebony Jones MRN: [id number] Date of Birth: [birth year]  Today's Date: 07/24/2017 OT Individual Time: 1250-1358 OT Individual Time Calculation (min): 68 min    Patient has met 11 of 11 long term goals due to improved activity tolerance, improved balance, postural control, ability to compensate for deficits, improved attention and improved awareness.  Patient to discharge at overall Supervision level.  Patient's care partner is independent to provide the necessary cognitive assistance at discharge.    Reasons goals not met: all goals met  Recommendation:  Patient will benefit from ongoing skilled OT services in outpatient setting to continue to advance functional skills in the area of BADL and iADL.  Equipment: No equipment provided  Reasons for discharge: treatment goals met  Patient/family agrees with progress made and goals achieved: Yes   OT Intervention: Upon entering the room, pt supine in bed with boyfriend present in room. Pt agreeable to OT intervention with no c/o pain this session. Pt ambulating to day room with overall supervision to complete design generation portion of the Cognitive Linguistic Quick Test. Pt asking several appropriate questions to clarify directions. Pt completed and ambulated back to room in same manner. Pt obtaining all clothing items and taking shower without use of equipment with intermittent supervision. Pt cleaning bathroom when done and taking all dirty items and placing in linen bag with distant supervision. Pt standing at sink for grooming with overall supervision as well. Pt able to locate and navigate back to gift shop from yesterdays session by memory without cues given from therapist. Pt returned without difficulty as well. Pt returning to bed at end of session and therapist reviewed pt progress and recommendations at discharge. Pt verbalized understanding and showed increased insight to  current deficits . Call bell and all needed items within reach upon exiting the room.     OT Discharge Precautions/Restrictions  Precautions Precautions: Fall Precaution Comments: TBI and visual deficits Restrictions Weight Bearing Restrictions: No  Pain Pain Assessment Pain Assessment: No/denies pain ADL ADL ADL Comments: see functional naviagtor Vision Baseline Vision/History: Wears glasses Wears Glasses: Reading only Patient Visual Report: Diplopia;Blurring of vision Additional Comments: B eyes tracking correctly in all directions. However, pt reports diplopia when initially looking out of R eye only but able to focus on items with increased time.  Cognition Overall Cognitive Status: Impaired/Different from baseline Arousal/Alertness: Awake/alert Orientation Level: Oriented X4 Attention: Selective;Alternating Sustained Attention: Appears intact Selective Attention: Appears intact Alternating Attention: Appears intact Memory: Impaired Memory Impairment: Storage deficit;Retrieval deficit;Decreased short term memory;Decreased recall of new information Awareness: Impaired Awareness Impairment: Anticipatory impairment Problem Solving: Impaired Problem Solving Impairment: Functional complex Safety/Judgment: Impaired [place] Scales of Cognitive Functioning: Purposeful/appropriate Sensation Sensation Light Touch: Appears Intact Stereognosis: Appears Intact Hot/Cold: Appears Intact Proprioception: Appears Intact Coordination Gross Motor Movements are Fluid and Coordinated: Yes Fine Motor Movements are Fluid and Coordinated: Yes Mobility  Bed Mobility Rolling Right: 7: Independent Rolling Left: 7: Independent Left Sidelying to Sit: 7: Independent Supine to Sit: 7: Independent Transfers Transfers: Sit to Stand;Stand to Sit Sit to Stand: 5: Supervision Stand to Sit: 5: Supervision  Trunk/Postural Assessment  Cervical Assessment Cervical Assessment: Within  Functional Limits Thoracic Assessment Thoracic Assessment: Within Functional Limits Lumbar Assessment Lumbar Assessment: Within Functional Limits  Balance Balance Balance Assessed: Yes Dynamic Sitting Balance Dynamic Sitting - Level of Assistance: 7: Independent Static Standing Balance Static Standing - Level of Assistance: 5: Stand by assistance Dynamic Standing Balance Dynamic  Standing - Level of Assistance: 5: Stand by assistance Extremity/Trunk Assessment RUE Assessment RUE Assessment: Within Functional Limits LUE Assessment LUE Assessment: Within Functional Limits   See Function Navigator for Current Functional Status.  Darleen Crocker P 07/24/2017, 2:08 PM

## 2017-07-24 NOTE — Patient Instructions (Signed)
Bridge With [employer] on back, both legs on top of ball, knees slightly flexed, arms on floor. Tighten buttocks while keeping abdominals tight and raise hips _6__ inches off floor. Do _1__ sets of _10__ repetitions. Advanced: Do not use arms for support.  Copyright  VHI. All rights reserved.  One-Leg Bridge With Knee [place]    Lie on back, one leg on top of ball, knee slightly flexed, other leg lifted toward ceiling, arms on floor. Tighten buttocks, keeping abdominals tight and raise hips _6__ inches off floor. Do _1__ sets of _5__ repetitions on each leg. Advanced: Do not use arms for support.  Copyright  VHI. All rights reserved.  Upper / Lower Extremity Extension (All-Fours)    Tighten stomach and raise right leg and opposite arm. Keep trunk rigid. Repeat __10__ times per set. Do _1___ sets per session. Do __1-2__ sessions per day.  http://orth.exer.us/1118   Copyright  VHI. All rights reserved.

## 2017-07-24 NOTE — Progress Notes (Addendum)
Physical Therapy Session Note  Patient Details  Name: Ebony Jones MRN: [id number] Date of Birth: 03/02/1971  Today's Date: 07/24/2017 PT Individual Time:Session1: 2035-5974; Session2: 1400 1430 PT Individual Time Calculation (min): 75 min; and 25 min  Short Term Goals: Week 3:  PT Short Term Goal 1 (Week 3): STG =LTG due to ELOS  Skilled Therapeutic Interventions/Progress Updates:    Session1: Patient reports some dizziness related to some medication taken this morning, more in R eye.  Patient performed supine to sit mod I and sit to stand S.  Ambulated x 200' S veering some to sides initially due to reports of dizziness.  Patient negotiated 12 steps with one rail ascending and two rails initially descending then 1 rail with step to vs step through technique and S.  Patient negotiated ramp and curb and walking on mulch with S.  Car transfer with S.  Educated on new plan for follow up PT after discussion with SW.  Patient performed core strengthening activities on mat with therapy ball in supine, in quadruped and prone with cues.  Gait activities with turn and stop, with ball toss and catch over shoulders, with toss and catching ball.  Performed balance activities for SLS including toe taps to cones x 3, tandem stand and tandem with head turns.  Seated on Nu Step for UE/LE endurance activity x 8 min at level 3.  Patient prepared coffee for herself, then in room discussed plans upon d/c and educated in strategies for self regulation and okay to let others know she needs a break.  Standing to brush teeth and left in chair with all needs in reach end of session.  Session2: Patient in room with boyfriend and father present.  Completed HEP for core strength/stability with pt and educated in same.  Handouts given and pt expressed appreciation.  Left EOB with family in room.   Therapy Documentation Precautions:  Precautions Precautions: Fall Precaution Comments: TBI; visual deficits, very  impulsive Restrictions Weight Bearing Restrictions: No General:   Pain: Pain Assessment Pain Assessment: No/denies pain   See Function Navigator for Current Functional Status.   Therapy/Group: Individual Therapy  Reginia Naas 07/24/2017, 8:55 AM

## 2017-07-24 NOTE — Progress Notes (Signed)
Speech Language Pathology Session Note & Discharge Summary  Patient Details  Name: Ebony Jones MRN: [id number] Date of Birth: 07/25/1971  Today's Date: 07/24/2017 SLP Individual Time: 1000-1100 SLP Individual Time Calculation (min): 60 min   Skilled Therapeutic Interventions:   Skilled treatment session focused on cognitive goals. SLP facilitated session by providing supervision-Min A verbal cues to generate a list of activities that patient can safely perform at home. Patient also completed visual scanning tasks with extra time and supervision-Min A verbal cues. Patient left upright in recliner with all needs within reach. Continue with current plan of care.    Patient has met 7 of 7 long term goals.  Patient to discharge at overall Supervision level.   Reasons goals not met: N/A   Clinical Impression/Discharge Summary: Patient has made excellent gains and has met 7 of 7 LTG's this admission. Currently, patient demonstrates behaviors consistent with a Rancho Level VIII and requires overall supervision-Min A verbal cues to complete functional and familiar tasks safely in regards to problem solving, attention, recall and awareness. Patient's biggest barrier to her overall safety is her decreased visual acuity, however, patient demonstrates increased awareness and ability to compensate for visual deficits.  Patient is consuming regular textures and thin liquids with Mod I and is Mod I for verbal expression. Patient and family education is complete and patient will discharge home with 24 hour supervision. Patient would benefit from f/u SLP services to maximize her cognitive function and overall functional independence in order to reduce caregiver burden.   Care Partner:  Caregiver Able to Provide Assistance: Yes  Type of Caregiver Assistance: Cognitive  Recommendation:  24 hour supervision/assistance;Outpatient SLP  Rationale for SLP Follow Up: Maximize cognitive function and  independence;Reduce caregiver burden   Equipment: N/A   Reasons for discharge: Treatment goals met;Discharged from hospital   Patient/Family Agrees with Progress Made and Goals Achieved: Yes   Function:  Eating Eating     Eating Assist Level: No help, No cues           Cognition Comprehension Comprehension assist level: Understands basic 90% of the time/cues < 10% of the time  Expression   Expression assist level: Expresses basic 90% of the time/requires cueing < 10% of the time.  Social Interaction Social Interaction assist level: Interacts appropriately 90% of the time - Needs monitoring or encouragement for participation or interaction.  Problem Solving Problem solving assist level: Solves basic 90% of the time/requires cueing < 10% of the time  Memory Memory assist level: Recognizes or recalls 75 - 89% of the time/requires cueing 10 - 24% of the time   [name] 07/24/2017, 2:22 PM

## 2017-07-24 NOTE — Plan of Care (Signed)
Patient met LTG's

## 2017-07-24 NOTE — Progress Notes (Signed)
[facility] PHYSICAL MEDICINE & REHABILITATION     PROGRESS NOTE    Subjective/Complaints:  lying in bed.  Very pleased that she is going home tomorrow.  Denies any complaints or problems other than her "right eye"  ROS: pt denies nausea, vomiting, diarrhea, cough, shortness of breath or chest pain    Objective:   Vital Signs: Blood pressure 102/73, pulse (!) 57, temperature 98 F (36.7 C), temperature source Oral, resp. rate 16, height 5\' 7"  (1.702 m), weight 66.5 kg (146 lb 9.7 oz), SpO2 98 %. No results found. No results for input(s): WBC, HGB, HCT, PLT in the last 72 hours. No results for input(s): NA, K, CL, GLUCOSE, BUN, CREATININE, CALCIUM in the last 72 hours.  Invalid input(s): CO CBG (last 3)  No results for input(s): GLUCAP in the last 72 hours.  Wt Readings from Last 3 Encounters:  07/03/17 66.5 kg (146 lb 9.7 oz)  07/03/17 66.5 kg (146 lb 9.7 oz)  03/28/17 68 kg (150 lb)    Physical Exam:  Constitutional: She appearswell-developedand well-nourished.No distress.in enclosure bed HENT:  Head:Normocephalicand atraumatic.  Eyes: Mydriasis right eye.  Neck:Normal range of motion.Neck supple.  Cardiovascular: RRR without murmur. No JVD      .  Respiratory:CTA Bilaterally without wheezes or rales. Normal effort     HK:VQQV.Bowel sounds are normal. She exhibitsno distension. There isno tenderness.  Musculoskeletal: She exhibits noedemaor tenderness.  Neurological: alert.  Improving awareness and memory as a whole.    Improving gaze.  Left eye with better medial movements still lacks superior movements.  Gaze remains disconjugate--stable.  language normal.    Moves all 4 limbs freely.   Skin: Skin iswarmand dry.  Psychiatric: Impulsive but redirectable   Assessment/Plan: 1. 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 Physiatrist is providing close team supervision and 24 hour management of active medical problems listed below. Physiatrist and rehab team continue to assess barriers to discharge/monitor patient progress toward functional and medical goals.  Function:  Bathing Bathing position Bathing activity did not occur: Refused Position: Shower  Bathing parts Body parts bathed by patient: Right arm, Left arm, Chest, Abdomen, Front perineal area, Buttocks, Right upper leg, Left upper leg, Right lower leg, Left lower leg Body parts bathed by helper: Back  Bathing assist Assist Level: Supervision or verbal cues   Set up : To obtain items  Upper Body Dressing/Undressing Upper body dressing   What is the patient wearing?: Pull over shirt/dress, Bra Bra - Perfomed by patient: Thread/unthread right bra strap, Thread/unthread left bra strap, Hook/unhook bra (pull down sports bra)   Pull over shirt/dress - Perfomed by patient: Thread/unthread right sleeve, Thread/unthread left sleeve, Put head through opening, Pull shirt over trunk Pull over shirt/dress - Perfomed by helper: Thread/unthread right sleeve, Thread/unthread left sleeve Button up shirt - Perfomed by patient: Pull shirt around back, Button/unbutton shirt, Thread/unthread left sleeve, Thread/unthread right sleeve Button up shirt - Perfomed by helper: Thread/unthread right sleeve, Thread/unthread left sleeve    Upper body assist Assist Level: Supervision or verbal cues      Lower Body Dressing/Undressing Lower body dressing   What is the patient wearing?: Non-skid slipper socks, Pants Underwear - Performed by patient: Thread/unthread right underwear leg, Thread/unthread left underwear leg, Pull underwear up/down Underwear - Performed by helper: Pull underwear up/down Pants- Performed by patient: Thread/unthread right pants leg, Thread/unthread left pants leg,  Pull pants up/down Pants- Performed by helper: Pull pants up/down Non-skid  slipper socks- Performed by patient: Don/doff right sock, Don/doff left sock                    Lower body assist Assist for lower body dressing: Supervision or verbal cues      Toileting Toileting Toileting activity did not occur: Refused Toileting steps completed by patient: Adjust clothing prior to toileting, Performs perineal hygiene, Adjust clothing after toileting Toileting steps completed by helper: Adjust clothing after toileting Toileting Assistive Devices: Grab bar or rail  Toileting assist Assist level: Supervision or verbal cues   Transfers Chair/bed transfer   Chair/bed transfer method: Ambulatory Chair/bed transfer assist level: Supervision or verbal cues Chair/bed transfer assistive device: Armrests     Locomotion Ambulation     Max distance: 300 Assist level: Supervision or verbal cues   Wheelchair   Type: Manual Max wheelchair distance: 50 Assist Level: Touching or steadying assistance (Pt > 75%)  Cognition Comprehension Comprehension assist level: Understands basic 90% of the time/cues < 10% of the time  Expression Expression assist level: Expresses basic 90% of the time/requires cueing < 10% of the time.  Social Interaction Social Interaction assist level: Interacts appropriately 90% of the time - Needs monitoring or encouragement for participation or interaction.  Problem Solving Problem solving assist level: Solves basic 75 - 89% of the time/requires cueing 10 - 24% of the time  Memory Memory assist level: Recognizes or recalls 75 - 89% of the time/requires cueing 10 - 24% of the time   Medical Problem List and Plan: 1.Functional and cognitive deficitssecondary to TBI -continue therapies.   Discharge home tomorrow -RLAS  V+.    2. DVT Prophylaxis/Anticoagulation: Patient is ambulatory 3.Headaches/Pain Management:tylenol prn 4. Mood:currently has no awareness of accident or deficits. LCSW to follow for now and  complete evaluation when appropriate 5. Neuropsych: This patientis notcapable of making decisions on herown behalf. -continue HS seroquel at HS   -inderal effective for headache.  Perhaps helping somewhat with impulsivity too. Dose at 20mg  tid but BP/HR soft--- continue to observe blood pressures as they remain somewhat low  -sleep chart    -Methylphenidate 10 mg twice daily -Is doing well with the low bed 6. Skin/Wound Care:routine pressure relief measures. 7. Fluids/Electrolytes/Nutrition: Continue to encourage p.o. Intake  8. ABLA: resolving.    -hgb up to 13.9   10. Optho: mydriasis right pupil, left CN III palsy-showing some improvement   -consvt care, patching recommended by optho   -outpt neuro-eye follow up at discharge depending upon recovery         LOS (Days) 21 A FACE TO [place] T, MD 07/24/2017 9:02 AM

## 2017-07-25 ENCOUNTER — Telehealth: Payer: Self-pay | Admitting: Physical Medicine and Rehabilitation

## 2017-07-25 ENCOUNTER — Other Ambulatory Visit: Payer: Self-pay

## 2017-07-25 DIAGNOSIS — R51 Headache: Secondary | ICD-10-CM

## 2017-07-25 DIAGNOSIS — R519 Headache, unspecified: Secondary | ICD-10-CM

## 2017-07-25 MED ORDER — LEVOTHYROXINE SODIUM 112 MCG PO TABS: 112.0000 ug | ORAL_TABLET | Freq: Every day | ORAL | 0 refills | Status: DC

## 2017-07-25 MED ORDER — METHYLPHENIDATE HCL 10 MG PO TABS: 10.0000 mg | ORAL_TABLET | Freq: Two times a day (BID) | ORAL | 0 refills | Status: DC

## 2017-07-25 MED ORDER — PROPRANOLOL HCL 20 MG PO TABS: 20.0000 mg | ORAL_TABLET | Freq: Three times a day (TID) | ORAL | 0 refills | Status: DC

## 2017-07-25 MED ORDER — NICOTINE 21 MG/24HR TD PT24: 21.0000 mg | MEDICATED_PATCH | Freq: Every day | TRANSDERMAL | 0 refills | Status: DC

## 2017-07-25 MED ORDER — QUETIAPINE FUMARATE 50 MG PO TABS
50.0000 mg | ORAL_TABLET | Freq: Every day | ORAL | 0 refills | Status: DC
Start: 2017-07-25 — End: 2018-02-26

## 2017-07-25 MED ORDER — ESCITALOPRAM OXALATE 20 MG PO TABS: 20.0000 mg | ORAL_TABLET | Freq: Every day | ORAL | 0 refills | Status: AC

## 2017-07-25 MED ORDER — FAMOTIDINE 10 MG PO TABS: 10.0000 mg | ORAL_TABLET | Freq: Two times a day (BID) | ORAL | 0 refills | Status: DC

## 2017-07-25 NOTE — Progress Notes (Signed)
[facility] PHYSICAL MEDICINE & REHABILITATION     PROGRESS NOTE    Subjective/Complaints: No problems overnight.  Patient very anxious to get home.  ROS: pt denies nausea, vomiting, diarrhea, cough, shortness of breath or chest pain    Objective:   Vital Signs: Blood pressure 128/64, pulse 76, temperature 97.9 F (36.6 C), temperature source Oral, resp. rate 18, height 5\' 7"  (1.702 m), weight 66.5 kg (146 lb 9.7 oz), SpO2 100 %. No results found. No results for input(s): WBC, HGB, HCT, PLT in the last 72 hours. Recent Labs    07/24/17 0800  NA 139  K 3.7  CL 107  GLUCOSE 106*  BUN 7  CREATININE 0.80  CALCIUM 9.4   CBG (last 3)  No results for input(s): GLUCAP in the last 72 hours.  Wt Readings from Last 3 Encounters:  07/03/17 66.5 kg (146 lb 9.7 oz)  07/03/17 66.5 kg (146 lb 9.7 oz)  03/28/17 68 kg (150 lb)    Physical Exam:  Constitutional: She appearswell-developedand well-nourished.No distress.in enclosure bed HENT:  Head:Normocephalicand atraumatic.  Eyes: Mydriasis right eye remains.  Neck:Normal range of motion.Neck supple.  Cardiovascular: RRR without murmur. No JVD      .  Respiratory:CTA Bilaterally without wheezes or rales. Normal effort     DT:OIZT.Bowel sounds are normal. She exhibitsno distension. There isno tenderness.  Musculoskeletal: She exhibits noedemaor tenderness.  Neurological: alert.  Improving awareness and memory as a whole.    Improving gaze.  Left eye with better medial movements still lacks superior movements.  Gaze remains disconjugate--stable to improved.  language normal.    Moves all 4 limbs freely.   Skin: Skin iswarmand dry.  Psychiatric: Impulsive but redirectable   Assessment/Plan: 1. 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 Physiatrist is providing close team  supervision and 24 hour management of active medical problems listed below. Physiatrist and rehab team continue to assess barriers to discharge/monitor patient progress toward functional and medical goals.  Function:  Bathing Bathing position Bathing activity did not occur: Refused Position: Shower  Bathing parts Body parts bathed by patient: Right arm, Left arm, Chest, Abdomen, Front perineal area, Buttocks, Right upper leg, Left upper leg, Right lower leg, Left lower leg Body parts bathed by helper: Back  Bathing assist Assist Level: Supervision or verbal cues   Set up : To obtain items  Upper Body Dressing/Undressing Upper body dressing   What is the patient wearing?: Pull over shirt/dress Bra - Perfomed by patient: Thread/unthread right bra strap, Thread/unthread left bra strap, Hook/unhook bra (pull down sports bra)   Pull over shirt/dress - Perfomed by patient: Thread/unthread right sleeve, Thread/unthread left sleeve, Put head through opening, Pull shirt over trunk Pull over shirt/dress - Perfomed by helper: Thread/unthread right sleeve, Thread/unthread left sleeve Button up shirt - Perfomed by patient: Pull shirt around back, Button/unbutton shirt, Thread/unthread left sleeve, Thread/unthread right sleeve Button up shirt - Perfomed by helper: Thread/unthread right sleeve, Thread/unthread left sleeve    Upper body assist Assist Level: Supervision or verbal cues      Lower Body Dressing/Undressing Lower body dressing   What is the patient wearing?: Non-skid slipper socks, Pants Underwear - Performed by patient: Thread/unthread right underwear leg, Thread/unthread left underwear leg, Pull underwear up/down Underwear - Performed by helper: Pull underwear up/down Pants- Performed by patient: Thread/unthread right pants leg, Thread/unthread left pants leg, Pull pants up/down  Pants- Performed by helper: Pull pants up/down Non-skid slipper socks- Performed by patient: Don/doff right  sock, Don/doff left sock                    Lower body assist Assist for lower body dressing: Supervision or verbal cues      Toileting Toileting Toileting activity did not occur: Refused Toileting steps completed by patient: Adjust clothing prior to toileting, Performs perineal hygiene, Adjust clothing after toileting Toileting steps completed by helper: Adjust clothing after toileting Toileting Assistive Devices: Grab bar or rail  Toileting assist Assist level: Supervision or verbal cues   Transfers Chair/bed transfer   Chair/bed transfer method: Ambulatory Chair/bed transfer assist level: Supervision or verbal cues Chair/bed transfer assistive device: Armrests     Locomotion Ambulation     Max distance: 300 Assist level: Supervision or verbal cues   Wheelchair   Type: Manual Max wheelchair distance: 50 Assist Level: Touching or steadying assistance (Pt > 75%)  Cognition Comprehension Comprehension assist level: Understands basic 90% of the time/cues < 10% of the time  Expression Expression assist level: Expresses basic 90% of the time/requires cueing < 10% of the time.  Social Interaction Social Interaction assist level: Interacts appropriately 90% of the time - Needs monitoring or encouragement for participation or interaction.  Problem Solving Problem solving assist level: Solves basic 90% of the time/requires cueing < 10% of the time  Memory Memory assist level: Recognizes or recalls 75 - 89% of the time/requires cueing 10 - 24% of the time   Medical Problem List and Plan: 1.Functional and cognitive deficitssecondary to TBI -continue therapies.   Discharge home today -[facility]AS  VI.      -Follow-up with me in 2-4 weeks 2. DVT Prophylaxis/Anticoagulation: Patient is ambulatory 3.Headaches/Pain Management:tylenol prn 4. Mood:currently has no awareness of accident or deficits. LCSW to follow for now and complete evaluation when  appropriate 5. Neuropsych: This patientis notcapable of making decisions on herown behalf. -continue HS seroquel at HS   -inderal effective for headache.  Perhaps helping somewhat with impulsivity too. Dose at 20mg  tid but BP/HR soft--- continue to observe blood pressures as they remain somewhat low  -sleep chart    -Methylphenidate 10 mg twice daily--continue this at home -Is doing well with the low bed 6. Skin/Wound Care:routine pressure relief measures. 7. Fluids/Electrolytes/Nutrition: Continue to encourage p.o. Intake  8. ABLA: resolving.    -hgb up to 13.9   10. Optho: mydriasis right pupil, left CN III palsy-showing some improvement   -consvt care, patching recommended by optho.    -outpt neuro-eye follow up at discharge depending upon recovery.  We will discuss more at the office when she sees me         LOS (Days) [place] T, MD 07/25/2017 9:07 AM

## 2017-07-25 NOTE — Telephone Encounter (Signed)
Patient's family called as Rx not covered by medicaid. MATCH filled out by LCSW and prescriptions resent to [facility] in [place].

## 2017-07-25 NOTE — Progress Notes (Addendum)
Social Work Discharge Note  The overall goal for the admission was met for:   Discharge location: Yes - home with family;  To d/c home with daughter, Debe Coder, who has completed education and will provide 24/7 supervision  Length of Stay: Yes - 22 days  Discharge activity level: Yes - supervision  Home/community participation: Yes  Services provided included: MD, RD, PT, OT, SLP, RN, TR, Pharmacy, Neuropsych and SW  Financial Services: Medicaid  Follow-up services arranged: Outpatient: PT, OT, ST via [facility] and Patient/Family has no preference for HH/DME agencies  Comments (or additional information): assisted pt with MATCH for medication assist as pt's Medicaid is family planning only.  Patient/Family verbalized understanding of follow-up arrangements: Yes  Individual responsible for coordination of the follow-up plan: pt/ dtr  Confirmed correct DME delivered: NA    Ebony Jones
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
## 2017-07-25 NOTE — Progress Notes (Signed)
Discharged home with daughter. Instructions given by Algis Liming, PA. Verbalizes understanding. Daughter states will be going to parents home. Out per wheelchair with belongings
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
## 2017-07-25 NOTE — Discharge Instructions (Signed)
Inpatient Rehab Discharge Instructions  Ebony Jones Discharge date and time: 07/25/17   Activities/Precautions/ Functional Status: Activity: no lifting, driving, or strenuous exercise  till cleared by MD Diet: regular diet Wound Care: none needed   Functional status:  ___ No restrictions     ___ Walk up steps independently _X__ 24/7 supervision/assistance   ___ Walk up steps with assistance ___ Intermittent supervision/assistance  ___ Bathe/dress independently ___ Walk with walker     ___ Bathe/dress with assistance ___ Walk Independently    ___ Shower independently ___ Walk with assistance    _X__ Shower with supervision _X__ No alcohol     ___ Return to work/school ________    COMMUNITY REFERRALS UPON DISCHARGE:    Outpatient: PT     OT    ST                   Agency:  [facility] Phone: [phone]                Appointment Date/Time:  08/08/17 @ 9:30 am (please arrive at 9:00) - speech therapy                                                                   08/09/17 @ 9:00 - 11:00 - physical and occupational therapy   GENERAL COMMUNITY RESOURCES FOR PATIENT/FAMILY:  Support Groups:  Brain Injury Support Group (see flyer)       Special Instructions:    My questions have been answered and I understand these instructions. I will adhere to these goals and the provided educational materials after my discharge from the hospital.  Patient/Caregiver Signature _______________________________ Date __________  Clinician Signature _______________________________________ Date __________  Please bring this form and your medication list with you to all your follow-up doctor's appointments.

## 2017-09-03 ENCOUNTER — Encounter: Payer: Medicaid Other | Attending: Physical Medicine & Rehabilitation | Admitting: Physical Medicine & Rehabilitation

## 2018-02-26 ENCOUNTER — Ambulatory Visit: Payer: Medicaid Other | Admitting: Neurology

## 2018-02-26 ENCOUNTER — Encounter: Payer: Self-pay | Admitting: *Deleted

## 2018-02-26 ENCOUNTER — Encounter: Payer: Self-pay | Admitting: Neurology

## 2018-02-26 VITALS — BP 103/72 | HR 70 | Ht 64.5 in | Wt 140.0 lb

## 2018-02-26 DIAGNOSIS — H53461 Homonymous bilateral field defects, right side: Secondary | ICD-10-CM

## 2018-02-26 DIAGNOSIS — S069X3A Unspecified intracranial injury with loss of consciousness of 1 hour to 5 hours 59 minutes, initial encounter: Secondary | ICD-10-CM

## 2018-02-26 DIAGNOSIS — H499 Unspecified paralytic strabismus: Secondary | ICD-10-CM | POA: Diagnosis not present

## 2018-02-26 NOTE — Patient Instructions (Signed)
MRI brain w/wo contrast 
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
## 2018-02-27 ENCOUNTER — Telehealth: Payer: Self-pay | Admitting: Neurology

## 2018-02-27 NOTE — Telephone Encounter (Signed)
Medicaid order sent to [facility]. They obtain the auth and will reach out to the pt to schedule.  °

## 2018-03-02 ENCOUNTER — Ambulatory Visit
Admission: RE | Admit: 2018-03-02 | Discharge: 2018-03-02 | Disposition: A | Discharge status: Home / Self Care | Payer: Medicaid Other | Source: Ambulatory Visit | Attending: Neurology | Admitting: Neurology

## 2018-03-02 DIAGNOSIS — H499 Unspecified paralytic strabismus: Secondary | ICD-10-CM | POA: Diagnosis not present

## 2018-03-02 DIAGNOSIS — S069X3A Unspecified intracranial injury with loss of consciousness of 1 hour to 5 hours 59 minutes, initial encounter: Secondary | ICD-10-CM | POA: Diagnosis not present

## 2018-03-02 DIAGNOSIS — H53461 Homonymous bilateral field defects, right side: Secondary | ICD-10-CM

## 2018-03-02 MED ORDER — GADOBENATE DIMEGLUMINE 529 MG/ML IV SOLN
12.0000 mL | Freq: Once | INTRAVENOUS | Status: AC | PRN
  Administered 2018-03-02: 12 mL via INTRAVENOUS

## 2018-03-05 ENCOUNTER — Telehealth: Payer: Self-pay | Admitting: *Deleted
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
## 2018-03-06 NOTE — Telephone Encounter (Signed)
Called Ebony Jones and LVM (ok per DPR) informing her that Dr. Jaynee Eagles is not referring her to a new eye doctor since she is already seeing Dr. Manuella Ghazi. Advised Ebony Jones to follow up with him. Asked for a call back if she has any questions. Left office number in message.

## 2018-03-06 NOTE — Telephone Encounter (Addendum)
Spoke with Dr. Jaynee Eagles. She is not referring to a new eye doctor. Pt already sees Dr. Manuella Ghazi. Pt should f/u with him. Attempted to call pt but her vm box was not setup yet.

## 2018-03-06 NOTE — Telephone Encounter (Signed)
Pt returned RN's call °

## 2018-05-07 DIAGNOSIS — Z0271 Encounter for disability determination: Secondary | ICD-10-CM
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
## 2018-11-05 IMAGING — DX DG CHEST 1V PORT
1 series · 1 of 1 positions shown · non-contrast
Comparison: 06/25/2017

CLINICAL DATA: Respiratory failure

EXAM:
PORTABLE CHEST 1 VIEW

[chest]
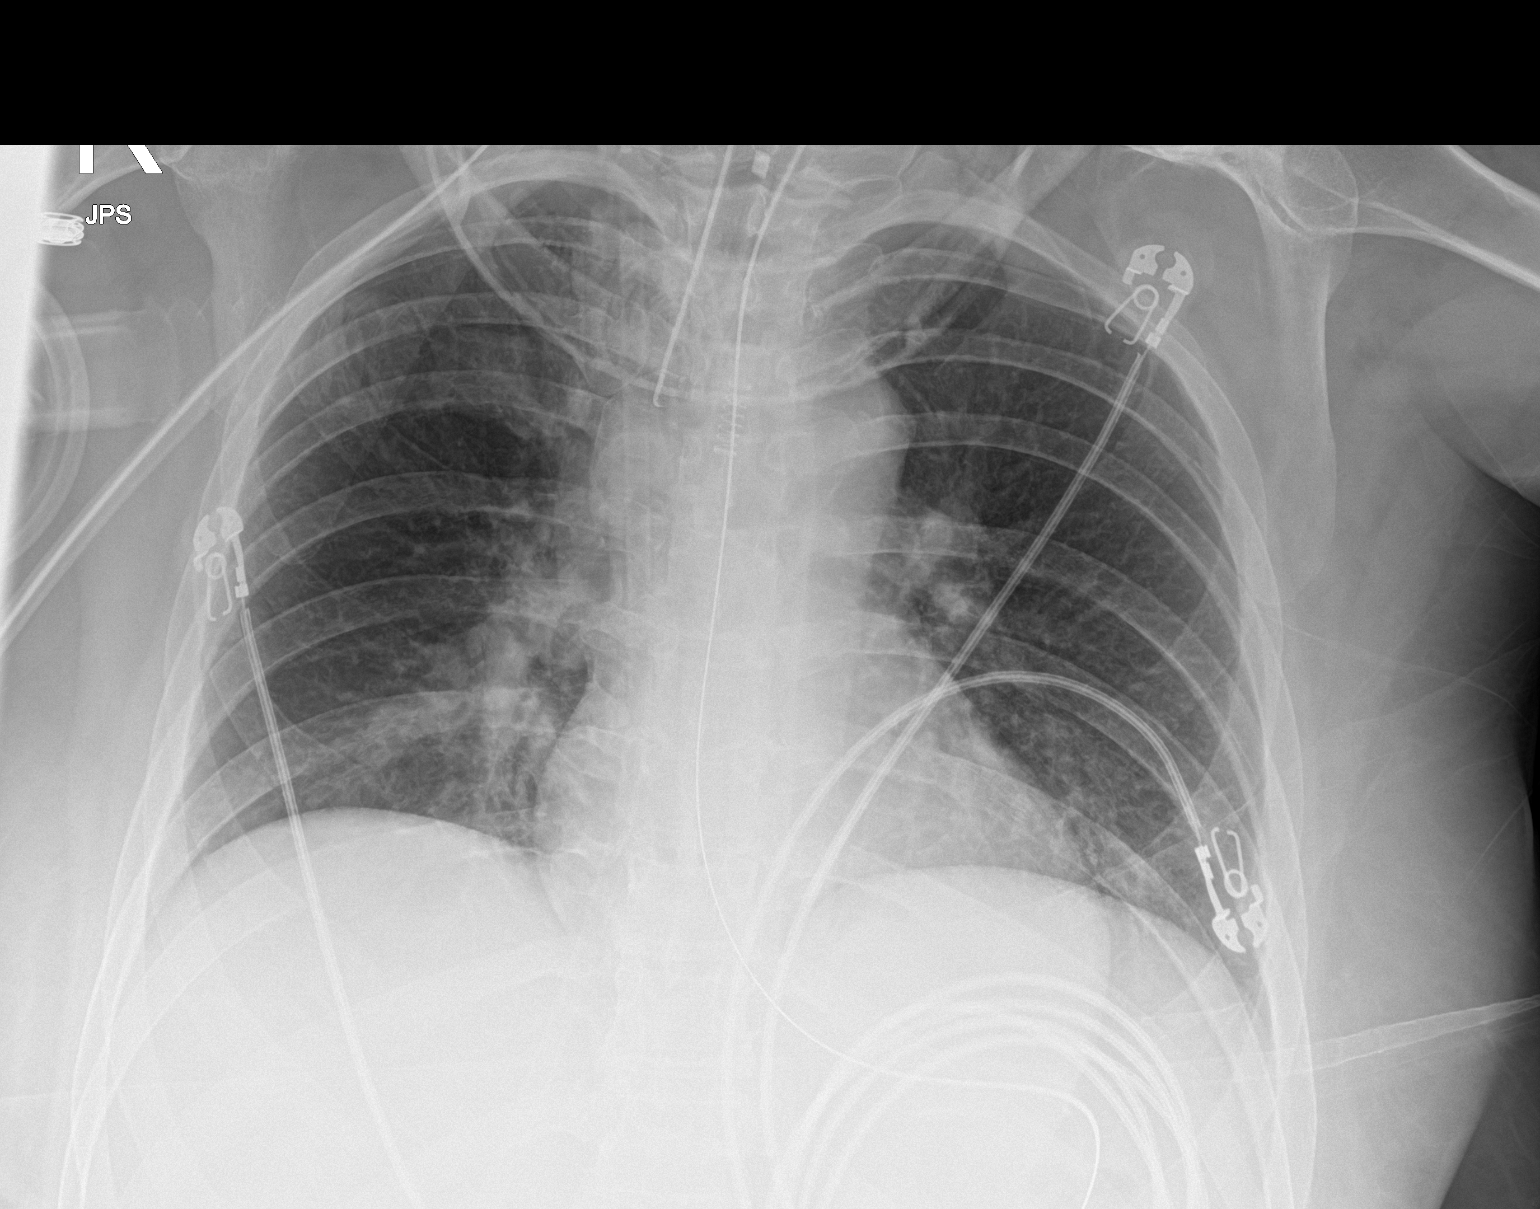

[1 of 1 positions shown; findings below may reference images not displayed]

FINDINGS: Cardiac shadow is stable. Endotracheal tube and nasogastric catheter
are noted in satisfactory position. The lungs are well aerated
bilaterally without focal infiltrate or sizable effusion.
IMPRESSION: No acute abnormality noted.

## 2021-11-10 ENCOUNTER — Other Ambulatory Visit: Payer: Self-pay

## 2023-02-15 ENCOUNTER — Other Ambulatory Visit (HOSPITAL_COMMUNITY): Payer: Self-pay | Admitting: Family Medicine

## 2023-02-15 DIAGNOSIS — Z1231 Encounter for screening mammogram for malignant neoplasm of breast: Secondary | ICD-10-CM

## 2023-03-01 ENCOUNTER — Ambulatory Visit (HOSPITAL_COMMUNITY)
Admission: RE | Admit: 2023-03-01 | Discharge: 2023-03-01 | Disposition: A | Discharge status: Home / Self Care | Payer: 59 | Source: Ambulatory Visit | Attending: Family Medicine | Admitting: Family Medicine

## 2023-03-01 ENCOUNTER — Ambulatory Visit (HOSPITAL_COMMUNITY): Payer: Medicaid Other

## 2023-03-01 DIAGNOSIS — Z1231 Encounter for screening mammogram for malignant neoplasm of breast: Secondary | ICD-10-CM | POA: Diagnosis present

## 2023-03-08 ENCOUNTER — Other Ambulatory Visit (HOSPITAL_COMMUNITY): Payer: Self-pay | Admitting: Family Medicine

## 2023-03-08 ENCOUNTER — Encounter (HOSPITAL_COMMUNITY): Payer: Self-pay | Admitting: Family Medicine

## 2023-03-08 DIAGNOSIS — R928 Other abnormal and inconclusive findings on diagnostic imaging of breast: Secondary | ICD-10-CM

## 2023-03-20 ENCOUNTER — Encounter (HOSPITAL_COMMUNITY): Payer: Self-pay

## 2023-03-20 ENCOUNTER — Ambulatory Visit (HOSPITAL_COMMUNITY)
Admission: RE | Admit: 2023-03-20 | Discharge: 2023-03-20 | Disposition: A | Discharge status: Home / Self Care | Payer: 59 | Source: Ambulatory Visit | Attending: Family Medicine | Admitting: Family Medicine

## 2023-03-20 DIAGNOSIS — R928 Other abnormal and inconclusive findings on diagnostic imaging of breast: Secondary | ICD-10-CM | POA: Diagnosis present

## 2023-03-21 ENCOUNTER — Encounter (HOSPITAL_COMMUNITY): Payer: Self-pay | Admitting: Family Medicine

## 2023-03-21 ENCOUNTER — Other Ambulatory Visit (HOSPITAL_COMMUNITY): Payer: Self-pay | Admitting: Family Medicine

## 2023-03-21 DIAGNOSIS — R928 Other abnormal and inconclusive findings on diagnostic imaging of breast: Secondary | ICD-10-CM

## 2023-09-25 ENCOUNTER — Ambulatory Visit (HOSPITAL_COMMUNITY)
Admission: RE | Admit: 2023-09-25 | Discharge: 2023-09-25 | Disposition: A | Discharge status: Home / Self Care | Payer: 59 | Source: Ambulatory Visit

## 2023-09-25 ENCOUNTER — Other Ambulatory Visit (HOSPITAL_COMMUNITY): Payer: Self-pay | Admitting: Family Medicine

## 2023-09-25 ENCOUNTER — Ambulatory Visit (HOSPITAL_COMMUNITY)
Admission: RE | Admit: 2023-09-25 | Discharge: 2023-09-25 | Disposition: A | Discharge status: Home / Self Care | Payer: 59 | Source: Ambulatory Visit | Attending: Family Medicine | Admitting: Family Medicine

## 2023-09-25 DIAGNOSIS — R928 Other abnormal and inconclusive findings on diagnostic imaging of breast: Secondary | ICD-10-CM
